# Patient Record
Sex: Male | Born: 1935 | Race: White | Hispanic: No | Marital: Married | Smoking: Never smoker
Health system: Southern US, Community
[De-identification: ages and names within clinical notes are randomized; demographics above are authoritative.]

## PROBLEM LIST (undated history)

## (undated) DIAGNOSIS — G20A1 Parkinson's disease without dyskinesia, without mention of fluctuations: Secondary | ICD-10-CM

## (undated) DIAGNOSIS — E785 Hyperlipidemia, unspecified: Secondary | ICD-10-CM

## (undated) DIAGNOSIS — C801 Malignant (primary) neoplasm, unspecified: Secondary | ICD-10-CM

## (undated) DIAGNOSIS — G20 Parkinson's disease: Secondary | ICD-10-CM

## (undated) DIAGNOSIS — Z974 Presence of external hearing-aid: Secondary | ICD-10-CM

## (undated) DIAGNOSIS — R159 Full incontinence of feces: Secondary | ICD-10-CM

## (undated) DIAGNOSIS — I10 Essential (primary) hypertension: Secondary | ICD-10-CM

## (undated) DIAGNOSIS — D649 Anemia, unspecified: Secondary | ICD-10-CM

## (undated) DIAGNOSIS — Z950 Presence of cardiac pacemaker: Secondary | ICD-10-CM

## (undated) DIAGNOSIS — Z8619 Personal history of other infectious and parasitic diseases: Secondary | ICD-10-CM

## (undated) DIAGNOSIS — I482 Chronic atrial fibrillation, unspecified: Secondary | ICD-10-CM

## (undated) DIAGNOSIS — I499 Cardiac arrhythmia, unspecified: Secondary | ICD-10-CM

## (undated) DIAGNOSIS — K529 Noninfective gastroenteritis and colitis, unspecified: Secondary | ICD-10-CM

## (undated) DIAGNOSIS — E559 Vitamin D deficiency, unspecified: Secondary | ICD-10-CM

## (undated) HISTORY — PX: FRACTURE SURGERY: SHX138

## (undated) HISTORY — PX: OTHER SURGICAL HISTORY: SHX169

## (undated) HISTORY — PX: APPENDECTOMY: SHX54

---

## 1970-06-05 HISTORY — PX: TONSILLECTOMY: SUR1361

## 1970-06-05 HISTORY — PX: CHOLECYSTECTOMY: SHX55

## 1978-06-05 HISTORY — PX: VASECTOMY: SHX75

## 2005-10-18 ENCOUNTER — Ambulatory Visit: Payer: Self-pay | Admitting: Gastroenterology

## 2009-07-20 ENCOUNTER — Ambulatory Visit (HOSPITAL_COMMUNITY): Admission: RE | Admit: 2009-07-20 | Discharge: 2009-07-20 | Payer: Self-pay | Admitting: Urology

## 2010-04-25 ENCOUNTER — Ambulatory Visit: Payer: Self-pay | Admitting: General Practice

## 2010-05-20 ENCOUNTER — Ambulatory Visit: Payer: Self-pay | Admitting: Internal Medicine

## 2010-05-22 ENCOUNTER — Emergency Department: Payer: Self-pay | Admitting: Emergency Medicine

## 2010-06-15 ENCOUNTER — Inpatient Hospital Stay: Payer: Self-pay | Admitting: Internal Medicine

## 2010-06-22 DIAGNOSIS — N400 Enlarged prostate without lower urinary tract symptoms: Secondary | ICD-10-CM

## 2010-06-22 DIAGNOSIS — E119 Type 2 diabetes mellitus without complications: Secondary | ICD-10-CM

## 2010-06-22 DIAGNOSIS — S32009A Unspecified fracture of unspecified lumbar vertebra, initial encounter for closed fracture: Secondary | ICD-10-CM

## 2010-06-22 DIAGNOSIS — R55 Syncope and collapse: Secondary | ICD-10-CM

## 2010-06-23 DIAGNOSIS — R071 Chest pain on breathing: Secondary | ICD-10-CM

## 2010-07-28 ENCOUNTER — Encounter: Payer: Self-pay | Admitting: Internal Medicine

## 2010-08-16 ENCOUNTER — Ambulatory Visit: Payer: Medicare Other | Admitting: Internal Medicine

## 2010-08-25 LAB — CBC
HCT: 40.5 % (ref 39.0–52.0)
Hemoglobin: 13.9 g/dL (ref 13.0–17.0)
MCHC: 34.3 g/dL (ref 30.0–36.0)
MCV: 93.6 fL (ref 78.0–100.0)
Platelets: 205 10*3/uL (ref 150–400)
RDW: 12.6 % (ref 11.5–15.5)

## 2010-08-25 LAB — COMPREHENSIVE METABOLIC PANEL
Albumin: 4.1 g/dL (ref 3.5–5.2)
Alkaline Phosphatase: 40 U/L (ref 39–117)
BUN: 24 mg/dL — ABNORMAL HIGH (ref 6–23)
Calcium: 9.2 mg/dL (ref 8.4–10.5)
Creatinine, Ser: 1.21 mg/dL (ref 0.4–1.5)
Glucose, Bld: 108 mg/dL — ABNORMAL HIGH (ref 70–99)
Total Protein: 7 g/dL (ref 6.0–8.3)

## 2010-08-25 LAB — GLUCOSE, CAPILLARY: Glucose-Capillary: 108 mg/dL — ABNORMAL HIGH (ref 70–99)

## 2010-09-01 NOTE — Miscellaneous (Signed)
Summary: [facility]  [facility] Report   Imported By: Kassie Mends 08/22/2010 09:08:45  _____________________________________________________________________  External Attachment:    Type:   Image     Comment:   External Document

## 2011-01-20 ENCOUNTER — Ambulatory Visit: Payer: Self-pay | Admitting: Otolaryngology

## 2012-01-10 ENCOUNTER — Encounter (HOSPITAL_COMMUNITY): Payer: Self-pay | Admitting: General Practice

## 2012-01-10 ENCOUNTER — Inpatient Hospital Stay (HOSPITAL_COMMUNITY)
Admission: RE | Admit: 2012-01-10 | Discharge: 2012-01-12 | DRG: 243 | Disposition: A | Discharge status: Home / Self Care | Payer: Medicare Other | Source: Other Acute Inpatient Hospital | Attending: Internal Medicine | Admitting: Internal Medicine

## 2012-01-10 DIAGNOSIS — Z7901 Long term (current) use of anticoagulants: Secondary | ICD-10-CM

## 2012-01-10 DIAGNOSIS — E119 Type 2 diabetes mellitus without complications: Secondary | ICD-10-CM | POA: Diagnosis present

## 2012-01-10 DIAGNOSIS — I495 Sick sinus syndrome: Principal | ICD-10-CM

## 2012-01-10 DIAGNOSIS — Z79899 Other long term (current) drug therapy: Secondary | ICD-10-CM

## 2012-01-10 DIAGNOSIS — I4891 Unspecified atrial fibrillation: Secondary | ICD-10-CM | POA: Diagnosis present

## 2012-01-10 DIAGNOSIS — H269 Unspecified cataract: Secondary | ICD-10-CM | POA: Diagnosis present

## 2012-01-10 DIAGNOSIS — I4892 Unspecified atrial flutter: Secondary | ICD-10-CM

## 2012-01-10 DIAGNOSIS — Z950 Presence of cardiac pacemaker: Secondary | ICD-10-CM

## 2012-01-10 HISTORY — DX: Malignant (primary) neoplasm, unspecified: C80.1

## 2012-01-10 HISTORY — DX: Essential (primary) hypertension: I10

## 2012-01-10 HISTORY — DX: Cardiac arrhythmia, unspecified: I49.9

## 2012-01-10 LAB — CARDIAC PANEL(CRET KIN+CKTOT+MB+TROPI)
CK, MB: 2.4 ng/mL (ref 0.3–4.0)
Relative Index: INVALID (ref 0.0–2.5)
Relative Index: INVALID (ref 0.0–2.5)
Total CK: 49 U/L (ref 7–232)
Total CK: 44 U/L (ref 7–232)

## 2012-01-10 LAB — GLUCOSE, CAPILLARY

## 2012-01-10 LAB — PROTIME-INR: Prothrombin Time: 23.4 seconds — ABNORMAL HIGH (ref 11.6–15.2)

## 2012-01-10 MED ORDER — WARFARIN SODIUM 5 MG PO TABS: 5.0000 mg | ORAL_TABLET | Freq: Every day | ORAL | Status: DC

## 2012-01-10 MED ORDER — SODIUM CHLORIDE 0.9 % IR SOLN
80.0000 mg | Status: DC
  Filled 2012-01-10: qty 2

## 2012-01-10 MED ORDER — CEFAZOLIN SODIUM-DEXTROSE 2-3 GM-% IV SOLR
2.0000 g | INTRAVENOUS | Status: DC
  Filled 2012-01-10: qty 50

## 2012-01-10 MED ORDER — TAMSULOSIN HCL 0.4 MG PO CAPS
0.4000 mg | ORAL_CAPSULE | Freq: Every day | ORAL | Status: DC
  Administered 2012-01-11 – 2012-01-12 (×2): 0.4 mg via ORAL
  Filled 2012-01-10 (×2): qty 1

## 2012-01-10 MED ORDER — SODIUM CHLORIDE 0.9 % IJ SOLN: 3.0000 mL | INTRAMUSCULAR | Status: DC | PRN

## 2012-01-10 MED ORDER — SODIUM CHLORIDE 0.9 % IV SOLN: 250.0000 mL | INTRAVENOUS | Status: DC

## 2012-01-10 MED ORDER — SODIUM CHLORIDE 0.9 % IJ SOLN
3.0000 mL | Freq: Two times a day (BID) | INTRAMUSCULAR | Status: DC
  Administered 2012-01-10: 3 mL via INTRAVENOUS

## 2012-01-10 MED ORDER — ACETAMINOPHEN 325 MG PO TABS
650.0000 mg | ORAL_TABLET | ORAL | Status: DC | PRN
  Administered 2012-01-11: 650 mg via ORAL
  Filled 2012-01-10: qty 2

## 2012-01-10 MED ORDER — ONDANSETRON HCL 4 MG/2ML IJ SOLN: 4.0000 mg | Freq: Four times a day (QID) | INTRAMUSCULAR | Status: DC | PRN

## 2012-01-10 MED ORDER — SODIUM CHLORIDE 0.45 % IV SOLN
INTRAVENOUS | Status: DC
  Administered 2012-01-10: 20:00:00 via INTRAVENOUS

## 2012-01-10 MED ORDER — GLIMEPIRIDE 4 MG PO TABS
4.0000 mg | ORAL_TABLET | Freq: Every day | ORAL | Status: DC
  Administered 2012-01-11 – 2012-01-12 (×2): 4 mg via ORAL
  Filled 2012-01-10 (×3): qty 1

## 2012-01-10 MED ORDER — WARFARIN - PHARMACIST DOSING INPATIENT: Freq: Every day | Status: DC

## 2012-01-10 MED ORDER — WARFARIN SODIUM 5 MG PO TABS
5.0000 mg | ORAL_TABLET | Freq: Once | ORAL | Status: AC
  Administered 2012-01-10: 5 mg via ORAL
  Filled 2012-01-10: qty 1

## 2012-01-10 MED ORDER — METFORMIN HCL 500 MG PO TABS
500.0000 mg | ORAL_TABLET | Freq: Two times a day (BID) | ORAL | Status: DC
  Administered 2012-01-10 – 2012-01-12 (×3): 500 mg via ORAL
  Filled 2012-01-10 (×6): qty 1

## 2012-01-10 MED ORDER — YOU HAVE A PACEMAKER BOOK
Freq: Once | Status: AC
  Administered 2012-01-10: 20:00:00
  Filled 2012-01-10: qty 1

## 2012-01-10 NOTE — H&P (Signed)
ELECTROPHYSIOLOGY ADMISSION HISTORY & PHYSICAL  Patient ID: Raymond Navarro MRN: [id number], DOB/AGE: 04-Nov-1928   Date of Admission: 01/10/2012  Primary Physician: Tillman Abide, MD Admitting Cardiologist: Lewayne Bunting, MD Reason for Admission: Near syncope, tachy-brady syndrome  History of Present Illness Raymond Navarro is a pleasant 78 year old gentleman with a past medical history significant for atrial fibrillation/atrial flutter who has been accepted in transfer from [facility]. He reports an episode of near syncope while visiting friends in [place] on [place]. He states they were standing in the kitchen making sandwiches for lunch when he began feeling dizzy. He then told his wife he was going to sit down. Initially he thought his symptoms may be due to low blood sugar. However his symptoms worsened and he describes the sense of urgency to use the restroom. He then stood up to go to the restroom and felt extremely weak. His wife was helping him walk down the hallway when he experienced near syncope. The patient is accompanied by his wife who assists with history questions. She states he was never unresponsive. That he appeared very pale "gray", diaphoretic and weak. EMS was activated and on their arrival his systolic blood pressure was in the 70s and his blood sugar was within normal range. On arrival to the emergency department, his pulse rate was in the 40s. Raymond Navarro and his wife tell me that a decreased the dose of his AV nodal blocking medications while at [facility]. This morning he continued to experience bradycardia, with rates again in the 40s; therefore, he was transferred here for further EP evaluation and treatment. Raymond Navarro denies any history of CAD/MI. He denies any history of valvular heart disease. He denies any history of thyroid dysfunction.  Past Medical History 1. Atrial fibrillation/atrial flutter 2. Diabetes mellitus 3.  Cataracts 4. s/p tonsillectomy  Allergies/Intolerances No Known Allergies  Home Medications Medications Prior to Admission  Medication Sig Dispense Refill  . diltiazem (CARDIZEM CD) 360 MG 24 hr capsule Take 360 mg by mouth daily.      Marland Kitchen glimepiride (AMARYL) 4 MG tablet Take 4 mg by mouth daily before breakfast.      . metFORMIN (GLUCOPHAGE) 500 MG tablet Take 500 mg by mouth 2 (two) times daily with a meal.      . metoprolol tartrate (LOPRESSOR) 25 MG tablet Take 25 mg by mouth 2 (two) times daily.      . Tamsulosin HCl (FLOMAX) 0.4 MG CAPS Take 0.4 mg by mouth daily.       Marland Kitchen warfarin (COUMADIN) 5 MG tablet Take 5 mg by mouth daily.        Family History Positive for CAD   Social History Social History  . Marital Status: Widowed   Social History Main Topics  . Smoking status: Nonsmoker   . Smokeless tobacco: No  . Alcohol Use: Not regularly  . Drug Use: No   Review of Systems General: No chills, fever, night sweats or weight changes.  Cardiovascular: No chest pain, dyspnea on exertion, edema, orthopnea, palpitations, paroxysmal nocturnal dyspnea. Dermatological: No rash, lesions or masses. Respiratory: No cough, dyspnea. Urologic: No hematuria, dysuria. Abdominal: No nausea, vomiting, diarrhea, bright red blood per rectum, melena, or hematemesis. Neurologic: No visual changes, abnormal mentation or difficulty moving extremities.  All other systems reviewed and are otherwise negative except as noted above.  Physical Exam Blood pressure 175/99, pulse 109, temperature 98 F (36.7 C), temperature source Oral, resp. rate 18, height  5\' 10"  (1.778 m), weight 153 lb (69.4 kg), SpO2 96.00%.  General: Well developed, well appearing 78 year old male in no acute distress. His wife is at bedside. HEENT: Normocephalic, atraumatic. EOMs intact. Sclera nonicteric. Oropharynx clear.  Neck: Supple without bruits. No JVD. Lungs:  Respirations regular and unlabored, CTA bilaterally. No  wheezes, rales or rhonchi. Heart: RRR. S1, S2 present. No murmurs, rub, S3 or S4. Abdomen: Soft, non-tender, non-distended. BS present x 4 quadrants. No hepatosplenomegaly.  Extremities: No clubbing, cyanosis or edema. DP/PT/Radials 2+ and equal bilaterally. Psych: Normal affect. Neuro: Alert and oriented X 3. Moves all extremities spontaneously.   Labs Admission labs currently pending Labs from [facility] -  Sodium 139, potassium 4.6, chloride 102, bicarbonate 25, BUN 25, creatinine 1.17, glucose 234 CK 65, CK-MB 2.0, troponin <0.012 White blood cell count 9200, hemoglobin 13.8, hematocrit 40, platelets 178,000 INR 1.77  Radiology/Studies Chest x-ray from [facility] - lungs are clear, appears to be elevation of the right hemidiaphragm, cardiac size and pulmonary vasculature are within normal limits, mild atherosclerotic change in the thoracic aorta  12-lead ECG  Admission 12-lead ECG pending 12-lead ECG performed on admission at [facility] shows atrial flutter at 44 beats per minute  Assessment and Plan 1. Atrial flutter with tachy-brady syndrome Raymond Navarro presents with near syncope and documented slow ventricular response and atrial flutter. His near syncopal episode was accompanied by prodrome of dizziness, nausea, pallor and diaphoresis which makes it appear most consistent with a vasovagal event; however, with his heart rate on admission documented to be in the 40s, cannot definitively exclude symptomatic bradycardia as cause. There are a couple of options for treatment in this case pending whether or not he truly has documented atrial fibrillation or if he only has atrial flutter. If in fact he only has atrial flutter, then consideration could be given to EP study +RF ablation as first-line and potentially curative treatment, which would allow discontinuation of all AV nodal agents. However, if he has both atrial fibrillation and atrial  flutter, he would still require AV nodal blocking medications for rate control. Therefore, permanent pacemaker implantation would be needed for treatment of bradycardia, to allow for continued rate control.These options were discussed with Raymond Navarro and his wife in detail. Dr. Ladona Ridgel will follow to assess Raymond Navarro and provide his formal recommendations.   Signed, Rick Duff, PA-C 01/10/2012, 3:53 PM  EP Attending  Patient seen and examined. I have reviewed the physical exam, history and assessment as noted above. Only change would be that his atrial flutter is left atrial and would not be amenable to ablation, at least not without significant risk. I would propose VVI PPM which we will plan to place tomorrow. I have discussed the risk/benefits/goal/expectations of PPM as well as limitations and he wishes to proceed.  Lewayne Bunting, M.D.

## 2012-01-10 NOTE — Progress Notes (Signed)
ANTICOAGULATION CONSULT NOTE - Initial Consult  Pharmacy Consult for coumadin Indication: atrial fibrillation  No Known Allergies  Patient Measurements: Height: 5\' 10"  (177.8 cm) Weight: 153 lb (69.4 kg) IBW/kg (Calculated) : 73    Vital Signs: Temp: 98 F (36.7 C) (08/07 1500) Temp src: Oral (08/07 1500) BP: 175/99 mmHg (08/07 1500) Pulse Rate: 109  (08/07 1500)  Labs: No results found for this basename: HGB:2,HCT:3,PLT:3,APTT:3,LABPROT:3,INR:3,HEPARINUNFRC:3,CREATININE:3,CKTOTAL:3,CKMB:3,TROPONINI:3 in the last 72 hours  Estimated Creatinine Clearance: 47 ml/min (by C-G formula based on Cr of 1.21).   Medical History: No past medical history on file.  Medications:  Prescriptions prior to admission  Medication Sig Dispense Refill  . diltiazem (CARDIZEM CD) 360 MG 24 hr capsule Take 360 mg by mouth daily.      Marland Kitchen glimepiride (AMARYL) 4 MG tablet Take 4 mg by mouth daily before breakfast.      . metFORMIN (GLUCOPHAGE) 500 MG tablet Take 500 mg by mouth 2 (two) times daily with a meal.      . metoprolol tartrate (LOPRESSOR) 25 MG tablet Take 25 mg by mouth 2 (two) times daily.      . Tamsulosin HCl (FLOMAX) 0.4 MG CAPS Take 0.4 mg by mouth daily.       Marland Kitchen warfarin (COUMADIN) 5 MG tablet Take 5 mg by mouth daily.        Assessment: 78 yo man to continue couamdin for afib.  INR today at OSH was 1.77. Goal of Therapy:  INR 2-3 Monitor platelets by anticoagulation protocol: Yes   Plan:  Coumadin 5 mg today. Check daily PT/INR. Monitor for S&S bleeding.  Talbert Cage Poteet 01/10/2012,4:42 PM

## 2012-01-11 ENCOUNTER — Inpatient Hospital Stay (HOSPITAL_COMMUNITY): Payer: Medicare Other

## 2012-01-11 ENCOUNTER — Encounter (HOSPITAL_COMMUNITY)
Admission: RE | Disposition: A | Discharge status: Home / Self Care | Payer: Self-pay | Source: Other Acute Inpatient Hospital | Attending: Internal Medicine

## 2012-01-11 ENCOUNTER — Encounter: Payer: Self-pay | Admitting: Internal Medicine

## 2012-01-11 DIAGNOSIS — Z950 Presence of cardiac pacemaker: Secondary | ICD-10-CM

## 2012-01-11 DIAGNOSIS — I498 Other specified cardiac arrhythmias: Secondary | ICD-10-CM

## 2012-01-11 HISTORY — PX: PERMANENT PACEMAKER INSERTION: SHX5480

## 2012-01-11 HISTORY — DX: Presence of cardiac pacemaker: Z95.0

## 2012-01-11 LAB — GLUCOSE, CAPILLARY
Glucose-Capillary: 115 mg/dL — ABNORMAL HIGH (ref 70–99)
Glucose-Capillary: 142 mg/dL — ABNORMAL HIGH (ref 70–99)

## 2012-01-11 LAB — CARDIAC PANEL(CRET KIN+CKTOT+MB+TROPI)
Relative Index: INVALID (ref 0.0–2.5)
Troponin I: <0.3 ng/mL (ref <0.30)

## 2012-01-11 LAB — CBC
Hemoglobin: 14.9 g/dL (ref 13.0–17.0)
MCV: 90.2 fL (ref 78.0–100.0)
Platelets: 169 10*3/uL (ref 150–400)
RBC: 4.89 MIL/uL (ref 4.22–5.81)
WBC: 8.7 10*3/uL (ref 4.0–10.5)

## 2012-01-11 LAB — TSH: TSH: 4.053 u[IU]/mL (ref 0.350–4.500)

## 2012-01-11 LAB — BASIC METABOLIC PANEL
CO2: 29 mEq/L (ref 19–32)
Calcium: 9 mg/dL (ref 8.4–10.5)
GFR calc non Af Amer: 78 mL/min — ABNORMAL LOW (ref >90)
Potassium: 4 mEq/L (ref 3.5–5.1)
Sodium: 141 mEq/L (ref 135–145)

## 2012-01-11 LAB — PROTIME-INR: Prothrombin Time: 21.1 seconds — ABNORMAL HIGH (ref 11.6–15.2)

## 2012-01-11 SURGERY — PERMANENT PACEMAKER INSERTION
Anesthesia: LOCAL

## 2012-01-11 MED ORDER — HEPARIN (PORCINE) IN NACL 2-0.9 UNIT/ML-% IJ SOLN
INTRAMUSCULAR | Status: AC
  Filled 2012-01-11: qty 1000

## 2012-01-11 MED ORDER — CEFAZOLIN SODIUM 1-5 GM-% IV SOLN
1.0000 g | Freq: Four times a day (QID) | INTRAVENOUS | Status: AC
  Administered 2012-01-11 – 2012-01-12 (×3): 1 g via INTRAVENOUS
  Filled 2012-01-11 (×3): qty 50

## 2012-01-11 MED ORDER — ONDANSETRON HCL 4 MG/2ML IJ SOLN: 4.0000 mg | Freq: Four times a day (QID) | INTRAMUSCULAR | Status: DC | PRN

## 2012-01-11 MED ORDER — WARFARIN SODIUM 7.5 MG PO TABS
7.5000 mg | ORAL_TABLET | Freq: Once | ORAL | Status: AC
  Administered 2012-01-11: 7.5 mg via ORAL
  Filled 2012-01-11: qty 1

## 2012-01-11 MED ORDER — METOPROLOL TARTRATE 25 MG PO TABS
25.0000 mg | ORAL_TABLET | Freq: Two times a day (BID) | ORAL | Status: DC
  Administered 2012-01-11 – 2012-01-12 (×3): 25 mg via ORAL
  Filled 2012-01-11 (×4): qty 1

## 2012-01-11 MED ORDER — MIDAZOLAM HCL 2 MG/2ML IJ SOLN
INTRAMUSCULAR | Status: AC
  Filled 2012-01-11: qty 2

## 2012-01-11 MED ORDER — FENTANYL CITRATE 0.05 MG/ML IJ SOLN
INTRAMUSCULAR | Status: AC
  Filled 2012-01-11: qty 2

## 2012-01-11 MED ORDER — LIDOCAINE HCL (PF) 1 % IJ SOLN
INTRAMUSCULAR | Status: AC
  Filled 2012-01-11: qty 60

## 2012-01-11 MED ORDER — DILTIAZEM HCL ER COATED BEADS 360 MG PO CP24
360.0000 mg | ORAL_CAPSULE | Freq: Every day | ORAL | Status: DC
  Administered 2012-01-11 – 2012-01-12 (×2): 360 mg via ORAL
  Filled 2012-01-11 (×3): qty 1

## 2012-01-11 MED ORDER — ACETAMINOPHEN 325 MG PO TABS
325.0000 mg | ORAL_TABLET | ORAL | Status: DC | PRN
  Administered 2012-01-12: 650 mg via ORAL
  Filled 2012-01-11: qty 2

## 2012-01-11 NOTE — Op Note (Signed)
NAMEJAMORIAN, Raymond Navarro              ACCOUNT NO.:  [id number]  MEDICAL RECORD NO.:  [id number]  LOCATION:  [facility]                         FACILITY:  [facility]  PHYSICIAN:  Doylene Canning. Ladona Ridgel, MD    DATE OF BIRTH:  [birth year]  DATE OF PROCEDURE:  01/11/2012 DATE OF DISCHARGE:                              OPERATIVE REPORT   PROCEDURE PERFORMED:  Insertion of a single-chamber pacemaker.  INDICATION:  Symptomatic bradycardia in the setting of chronic AFib and flutter.  INTRODUCTION:  The patient is an 78 year old male with a history of chronic atrial fibrillation/flutter.  He has had tachy-brady syndrome in the past.  His ventricular rates have been fairly well controlled.  He was visiting [place] in the [place] when he developed symptomatic bradycardia with heart rates in the 30s and 40s. This persisted.  With discontinuation of his medications, his heart rates increased over 110 beats per minute.  With symptomatic tachy- brady, he is now referred for permanent pacemaker insertion.  PROCEDURE:  After informed consent was obtained, the patient was taken to diagnostic catheterization lab in a fasting state.  After usual preparation and draping, intravenous fentanyl and midazolam was given for sedation.  A 30 mL of lidocaine was infiltrated into the left infraclavicular region.  Initial attempts to puncture the subclavian vein were unsuccessful.  A 10 mL of contrast was injected into the left upper extremity venous system, demonstrating that the vein was patent but displaced caudally.  It was then punctured and the Medtronic model 5076, 58 cm active fixation pacing lead, serial number [id number] was advanced into the right ventricle.  Mapping was carried out and in the final site, the R-waves measured 7.5 mV, with a pacing impedance 1100 ohms and threshold 0.4 V at 0.5 msec.  A prominent injury current was present and 10 V pacing did not stimulate the diaphragm.  With  the ventricular lead in satisfactory position, it was secured to the subpectoralis fascia with a figure-of-eight silk suture.  The sewing sleeve was also secured with silk suture.  A Medtronic Sensia single chamber pacemaker, serial number [id number] was connected to the pacing lead and placed back in the subcutaneous pocket.  The pocket was irrigated with antibiotic irrigation and the incision was closed with 2- 0 and 3-0 Vicryl.  Benzoin and Steri-Strips were painted on the skin, pressure dressing was applied, and the patient was returned to his room in satisfactory condition.  COMPLICATIONS:  There were no immediate procedure complications.  RESULTS:  Demonstrate successful implantation of a Medtronic single- chamber pacemaker in a patient with symptomatic bradycardia and chronic atrial fibrillation and flutter.     Doylene Canning. Ladona Ridgel, MD     GWT/MEDQ  D:  01/11/2012  T:  01/11/2012  Job:  [id number]  cc:   Arnoldo Hooker, MD

## 2012-01-11 NOTE — Interval H&P Note (Signed)
History and Physical Interval Note:  01/11/2012 7:36 AM  Raymond Navarro  has presented today for surgery, with the diagnosis of sinus Arrest  The various methods of treatment have been discussed with the patient and family. After consideration of risks, benefits and other options for treatment, the patient has consented to  Procedure(s) (LRB): PERMANENT PACEMAKER INSERTION (N/A) as a surgical intervention .  The patient's history has been reviewed, patient examined, no change in status, stable for surgery.  I have reviewed the patient's chart and labs.  Questions were answered to the patient's satisfaction.     Buel Ream.D

## 2012-01-11 NOTE — Progress Notes (Signed)
ANTICOAGULATION CONSULT NOTE - Follow Up Consult  Pharmacy Consult for Coumadin Indication: atrial fibrillation  No Known Allergies  Patient Measurements: Height: 5\' 10"  (177.8 cm) Weight: 153 lb (69.4 kg) IBW/kg (Calculated) : 73  Heparin Dosing Weight: 3  Vital Signs: Temp: 97.8 F (36.6 C) (08/08 0500) Temp src: Oral (08/08 0500) BP: 151/93 mmHg (08/08 0500) Pulse Rate: 83  (08/08 0752)  Labs:  Basename 01/11/12 0410 01/10/12 2130 01/10/12 1736  HGB 14.9 -- --  HCT 44.1 -- --  PLT 169 -- --  APTT -- -- --  LABPROT 21.1* -- 23.4*  INR 1.79* -- 2.04*  HEPARINUNFRC -- -- --  CREATININE 0.90 -- --  CKTOTAL 48 44 49  CKMB 2.7 2.3 2.4  TROPONINI <0.30 <0.30 <0.30    Estimated Creatinine Clearance: 63.2 ml/min (by C-G formula based on Cr of 0.9).   Assessment: Transferred from OSH for further EP eval and PPM insertion for near syncope and tachy/brady syndrome.  Anticoagulation: coumadin PTA 5mg  daily for afib/flutter. INR at OSH was 1.77. INR today down to 1.79   DM: CBG 127,122 on metformin  Goal of Therapy:  INR 2-3 Monitor platelets by anticoagulation protocol: Yes   Plan:  Coumadin 7.5mg  po x 1 today.  Merilynn Finland, [employer] 01/11/2012,9:51 AM

## 2012-01-11 NOTE — Op Note (Signed)
VVI PPM inserted via the left subclavian vein without immediate complication. [id number].

## 2012-01-12 ENCOUNTER — Encounter: Payer: Self-pay | Admitting: *Deleted

## 2012-01-12 ENCOUNTER — Inpatient Hospital Stay (HOSPITAL_COMMUNITY): Payer: Medicare Other

## 2012-01-12 DIAGNOSIS — Z950 Presence of cardiac pacemaker: Secondary | ICD-10-CM | POA: Insufficient documentation

## 2012-01-12 LAB — GLUCOSE, CAPILLARY: Glucose-Capillary: 175 mg/dL — ABNORMAL HIGH (ref 70–99)

## 2012-01-12 MED ORDER — WARFARIN SODIUM 5 MG PO TABS
5.0000 mg | ORAL_TABLET | Freq: Every day | ORAL | Status: DC
  Filled 2012-01-12: qty 1

## 2012-01-12 NOTE — Discharge Summary (Signed)
ELECTROPHYSIOLOGY DISCHARGE SUMMARY    Patient ID: Raymond Navarro,  MRN: [id number], DOB/AGE: 76/22/1930 76 y.o.  Admit date: 01/10/2012 Discharge date: 01/12/2012  Primary Care Physician: Tillman Abide, MD Primary Cardiologist: Gwen Pounds, MD in [place], [place]  Primary Discharge Diagnosis:  1. Tachy-brady syndrome s/p PPM implantation 2. Chronic atrial fibrillation/atrial flutter  Secondary Discharge Diagnoses:  1. DM 2. Cataracts  Procedures This Admission:  1. Single chamber/VVI PPM implantation 01/11/2012 Medtronic model 5076, 58 cm active fixation pacing lead, serial number [id number] was advanced into the right ventricle. Medtronic [place] single chamber pacemaker, serial number [id number].  History and Hospital Course:  Please see admission H&P for full details. Briefly, Raymond Navarro is a pleasant 78 year old gentleman with chronic atrial fibrillation/atrial flutter who was admitted with near syncope. He was found to have bradycardia with rates in the 30s-40s. He was diagnosed with tachy-brady syndrome and PPM implantation was recommended to allow for continuation of his rate control regimen. He underwent single chamber PPM implantation yesterday 01/11/2012. Raymond Navarro tolerated this procedure well without any immediate complication. He remains hemodynamically stable and afebrile. His chest xray shows stable lead placement without pneumothorax. His device interrogation shows normal PPM function with stable lead parameters/measurements. His implant site is intact without significant bleeding or hematoma. He has been given discharge instructions including wound care and activity restrictions. He will follow-up in 10 days for wound check. There were no changes made to his medications. He has been seen, examined and deemed stable for discharge today by Dr. Lewayne Bunting.  Physical Exam:  Vitals: Blood pressure 125/78, pulse 77, temperature 98.2 F (36.8 C), temperature source Oral,  resp. rate 16, height 5\' 10"  (1.778 m), weight 153 lb (69.4 kg), SpO2 95.00%.  General: Well developed, well appearing 78 year old male in no acute distress. Heart: Regular S1, S2 without M/R/S3/S4. Lungs: CTA bilaterally. No wheezes, rales or rhonchi. Extremities: No cyanosis, clubbing or edema. Skin: Left upper chest/implant site intact without bleeding or hematoma.  Labs: Lab Results  Component Value Date   WBC 8.7 01/11/2012   HGB 14.9 01/11/2012   HCT 44.1 01/11/2012   MCV 90.2 01/11/2012   PLT 169 01/11/2012     Lab 01/11/12 0410  NA 141  K 4.0  CL 103  CO2 29  BUN 13  CREATININE 0.90  CALCIUM 9.0  PROT --  BILITOT --  ALKPHOS --  ALT --  AST --  GLUCOSE 114*   Lab Results  Component Value Date   CKTOTAL 48 01/11/2012   CKMB 2.7 01/11/2012   TROPONINI <0.30 01/11/2012     Basename 01/11/12 0410  INR 1.79*    Disposition:  The patient is being discharged in stable condition.  Follow-up: Follow-up Information    Follow up with [facility] on 01/22/2012. (At 1:15 PM  for wound check)    Contact information:   [address] [place] [address] [phone]      Follow up with Lewayne Bunting, MD on 04/12/2012. (At 10:00 AM)    Contact information:   [address] [place] [address] [phone]       Follow up with Lamar Blinks, MD. (As previously scheduled next week)    Contact information:   [address] [place] [address] [phone]        Discharge Medications:  Medication List  As of 01/12/2012  8:20 AM  TAKE these medications         diltiazem 360 MG 24 hr capsule   Commonly known as: CARDIZEM CD   Take 360 mg by mouth daily.      glimepiride 4 MG tablet   Commonly known as: AMARYL   Take 4 mg by mouth daily before breakfast.      metFORMIN 500 MG tablet   Commonly known as: GLUCOPHAGE   Take 500 mg by mouth 2 (two) times daily with a  meal.      metoprolol tartrate 25 MG tablet   Commonly known as: LOPRESSOR   Take 25 mg by mouth 2 (two) times daily.      Tamsulosin HCl 0.4 MG Caps   Commonly known as: FLOMAX   Take 0.4 mg by mouth daily.      warfarin 5 MG tablet   Commonly known as: COUMADIN   Take 5 mg by mouth daily.          Duration of Discharge Encounter: Greater than 30 minutes including physician time.  Signed, Rick Duff, PA-C 01/12/2012, 8:20 AM  EP attending  Patient seen and examined. Agree with plan as noted above.  Lewayne Bunting, M.D.

## 2012-01-12 NOTE — Progress Notes (Signed)
ANTICOAGULATION CONSULT NOTE - Follow Up Consult  Pharmacy Consult for Coumadin Indication: atrial fibrillation  No Known Allergies  Patient Measurements: Height: 5\' 10"  (177.8 cm) Weight: 153 lb (69.4 kg) IBW/kg (Calculated) : 73  Heparin Dosing Weight: 3  Vital Signs: Temp: 98.2 F (36.8 C) (08/09 0616) Temp src: Oral (08/09 0616) BP: 125/78 mmHg (08/09 0616) Pulse Rate: 77  (08/09 0616)  Labs:  Basename 01/12/12 0836 01/11/12 0410 01/10/12 2130 01/10/12 1736  HGB -- 14.9 -- --  HCT -- 44.1 -- --  PLT -- 169 -- --  APTT -- -- -- --  LABPROT 24.3* 21.1* -- 23.4*  INR 2.14* 1.79* -- 2.04*  HEPARINUNFRC -- -- -- --  CREATININE -- 0.90 -- --  CKTOTAL -- 48 44 49  CKMB -- 2.7 2.3 2.4  TROPONINI -- <0.30 <0.30 <0.30    Estimated Creatinine Clearance: 63.2 ml/min (by C-G formula based on Cr of 0.9).   Assessment: Transferred from OSH for further EP eval and PPM insertion for near syncope and tachy/brady syndrome.  Anticoagulation: coumadin PTA 5mg  daily for afib/flutter. INR = 2.14 today after giving a slightly higher dose yesterday.   Goal of Therapy:  INR 2-3 Monitor platelets by anticoagulation protocol: Yes   Plan:  Coumadin 5 mg PO qday  Raymond Navarro 01/12/2012,9:38 AM

## 2012-01-18 ENCOUNTER — Telehealth: Payer: Self-pay | Admitting: Internal Medicine

## 2012-01-18 NOTE — Telephone Encounter (Signed)
Spoke w/pt in regards to taking a shower. Pt implanted 01-11-12. Pt also reminded of appt with Brooke 01-23-12 @ 1315.

## 2012-01-18 NOTE — Telephone Encounter (Signed)
New msg Pt had device put in and he wanted to know when he can take a shower

## 2012-01-23 ENCOUNTER — Encounter: Payer: Self-pay | Admitting: Cardiology

## 2012-01-23 ENCOUNTER — Ambulatory Visit (INDEPENDENT_AMBULATORY_CARE_PROVIDER_SITE_OTHER): Payer: Medicare Other | Admitting: Cardiology

## 2012-01-23 ENCOUNTER — Encounter: Payer: Self-pay | Admitting: Internal Medicine

## 2012-01-23 VITALS — BP 128/64 | HR 68 | Ht 70.0 in | Wt 154.0 lb

## 2012-01-23 DIAGNOSIS — I495 Sick sinus syndrome: Secondary | ICD-10-CM

## 2012-01-23 DIAGNOSIS — Z950 Presence of cardiac pacemaker: Secondary | ICD-10-CM

## 2012-01-23 LAB — PACEMAKER DEVICE OBSERVATION
BRDY-0002RV: 60 {beats}/min
RV LEAD IMPEDENCE PM: 685 Ohm

## 2012-01-23 NOTE — Progress Notes (Signed)
Wound check, device check only. See PaceArt.

## 2012-04-10 ENCOUNTER — Encounter: Payer: Self-pay | Admitting: Internal Medicine

## 2012-04-10 ENCOUNTER — Ambulatory Visit (INDEPENDENT_AMBULATORY_CARE_PROVIDER_SITE_OTHER): Payer: Medicare Other | Admitting: Internal Medicine

## 2012-04-10 VITALS — BP 126/66 | HR 79 | Ht 70.0 in | Wt 159.0 lb

## 2012-04-10 DIAGNOSIS — Z950 Presence of cardiac pacemaker: Secondary | ICD-10-CM

## 2012-04-10 DIAGNOSIS — I4891 Unspecified atrial fibrillation: Secondary | ICD-10-CM

## 2012-04-10 LAB — PACEMAKER DEVICE OBSERVATION
BATTERY VOLTAGE: 2.79 V
BMOD-0005RV: 95 {beats}/min
RV LEAD AMPLITUDE: 15.68 mv
RV LEAD THRESHOLD: 0.5 V

## 2012-04-10 NOTE — Patient Instructions (Signed)
Your physician wants you to follow-up in: 9 months in [place] with Dr. Ladona Ridgel.  You will receive a reminder letter in the mail two months in advance. If you don't receive a letter, please call our office to schedule the follow-up appointment.

## 2012-04-10 NOTE — Assessment & Plan Note (Signed)
His ventricular rates appear to be well-controlled. He will continue his current medical therapy.

## 2012-04-10 NOTE — Assessment & Plan Note (Signed)
His Medtronic single-chamber pacemaker is working normally. We'll plan to recheck in several months. 

## 2012-04-10 NOTE — Progress Notes (Signed)
HPI Raymond Navarro returns today for followup. He is a very pleasant 78 year old man with a history of hypertension, chronic atrial fibrillation, symptomatic bradycardia, status post permanent pacemaker insertion. In the interim, he has done well. He underwent pacemaker insertion 3 months ago and has had minimal discomfort at the insertion site. No syncope, no chest pain, no shortness of breath, and minimal peripheral edema. No Known Allergies   Current Outpatient Prescriptions  Medication Sig Dispense Refill  . diltiazem (CARDIZEM CD) 360 MG 24 hr capsule Take 360 mg by mouth daily.      Marland Kitchen glimepiride (AMARYL) 4 MG tablet Take 4 mg by mouth daily before breakfast.      . metFORMIN (GLUCOPHAGE) 500 MG tablet Take 500 mg by mouth 2 (two) times daily with a meal.      . metoprolol tartrate (LOPRESSOR) 25 MG tablet Take 25 mg by mouth 2 (two) times daily.      . Tamsulosin HCl (FLOMAX) 0.4 MG CAPS Take 0.4 mg by mouth daily.       Marland Kitchen warfarin (COUMADIN) 5 MG tablet Take 5 mg by mouth as directed.          Past Medical History  Diagnosis Date  . Hypertension   . Dysrhythmia     ATRIAL FIBRILATION  . Diabetes mellitus     type 2  . Cancer     hx of skin cancer    ROS:   All systems reviewed and negative except as noted in the HPI.   Past Surgical History  Procedure Date  . Tonsillectomy   . Cholecystectomy   . Appendectomy   . Vasectomy      No family history on file.   History   Social History  . Marital Status: Widowed    Spouse Name: N/A    Number of Children: N/A  . Years of Education: N/A   Occupational History  . Not on file.   Social History Main Topics  . Smoking status: Never Smoker   . Smokeless tobacco: Never Used  . Alcohol Use: Yes     Comment: occasional  . Drug Use: No  . Sexually Active:    Other Topics Concern  . Not on file   Social History Narrative  . No narrative on file     BP 126/66  Pulse 79  Ht 5\' 10"  (1.778 m)  Wt 159 lb  (72.122 kg)  BMI 22.81 kg/m2  SpO2 98%  Physical Exam:  Well appearing elderly man, NAD HEENT: Unremarkable Neck:  7 cm JVD, no thyromegally Lungs:  Clear with no wheezes, rales, or rhonchi.  HEART:  IRegular rate rhythm, no murmurs, no rubs, no clicks Abd:  soft, positive bowel sounds, no organomegally, no rebound, no guarding Ext:  2 plus pulses, no edema, no cyanosis, no clubbing Skin:  No rashes no nodules Neuro:  CN II through XII intact, motor grossly intact  DEVICE  Normal device function.  See PaceArt for details.   Assess/Plan:

## 2012-04-12 ENCOUNTER — Encounter: Payer: Medicare Other | Admitting: Internal Medicine

## 2012-07-13 LAB — COMPREHENSIVE METABOLIC PANEL
Albumin: 3.9 g/dL (ref 3.4–5.0)
Anion Gap: 6 — ABNORMAL LOW (ref 7–16)
BUN: 18 mg/dL (ref 7–18)
Bilirubin,Total: 0.7 mg/dL (ref 0.2–1.0)
Calcium, Total: 8.7 mg/dL (ref 8.5–10.1)
Creatinine: 0.62 mg/dL (ref 0.60–1.30)
EGFR (Non-African Amer.): >60
Potassium: 4.2 mmol/L (ref 3.5–5.1)
SGPT (ALT): 31 U/L (ref 12–78)
Sodium: 137 mmol/L (ref 136–145)
Total Protein: 7.3 g/dL (ref 6.4–8.2)

## 2012-07-13 LAB — CBC
MCH: 30.5 pg (ref 26.0–34.0)
MCHC: 34 g/dL (ref 32.0–36.0)
MCV: 90 fL (ref 80–100)
Platelet: 190 10*3/uL (ref 150–440)
RBC: 4.74 10*6/uL (ref 4.40–5.90)

## 2012-07-13 LAB — PROTIME-INR: INR: 2.4

## 2012-07-13 LAB — LIPASE, BLOOD: Lipase: 104 U/L (ref 73–393)

## 2012-07-13 LAB — APTT: Activated PTT: 30.1 secs (ref 23.6–35.9)

## 2012-07-14 ENCOUNTER — Inpatient Hospital Stay: Payer: Self-pay | Admitting: Internal Medicine

## 2012-07-15 LAB — CBC WITH DIFFERENTIAL/PLATELET
Eosinophil #: 0.5 10*3/uL (ref 0.0–0.7)
Eosinophil %: 5.1 %
HCT: 40 % (ref 40.0–52.0)
Lymphocyte #: 1.4 10*3/uL (ref 1.0–3.6)
Lymphocyte %: 14.8 %
MCHC: 34.5 g/dL (ref 32.0–36.0)
MCV: 89 fL (ref 80–100)
Monocyte #: 0.9 x10 3/mm (ref 0.2–1.0)
Neutrophil #: 6.9 10*3/uL — ABNORMAL HIGH (ref 1.4–6.5)
Platelet: 171 10*3/uL (ref 150–440)
WBC: 9.8 10*3/uL (ref 3.8–10.6)

## 2012-07-15 LAB — MAGNESIUM: Magnesium: 1.4 mg/dL — ABNORMAL LOW

## 2012-07-15 LAB — PROTIME-INR: INR: 1.8

## 2012-07-16 LAB — CBC WITH DIFFERENTIAL/PLATELET
Basophil %: 0.4 %
Eosinophil #: 0.2 10*3/uL (ref 0.0–0.7)
Eosinophil %: 1.8 %
HCT: 41.1 % (ref 40.0–52.0)
Lymphocyte #: 1.6 10*3/uL (ref 1.0–3.6)
MCH: 29.9 pg (ref 26.0–34.0)
MCHC: 33.4 g/dL (ref 32.0–36.0)
Neutrophil #: 8.6 10*3/uL — ABNORMAL HIGH (ref 1.4–6.5)
Neutrophil %: 75 %
Platelet: 191 10*3/uL (ref 150–440)
WBC: 11.4 10*3/uL — ABNORMAL HIGH (ref 3.8–10.6)

## 2012-07-16 LAB — PROTIME-INR: INR: 1.2

## 2012-07-16 LAB — MAGNESIUM: Magnesium: 1.7 mg/dL — ABNORMAL LOW

## 2012-07-17 LAB — BASIC METABOLIC PANEL
Anion Gap: 7 (ref 7–16)
BUN: 18 mg/dL (ref 7–18)
Calcium, Total: 7.9 mg/dL — ABNORMAL LOW (ref 8.5–10.1)
Chloride: 101 mmol/L (ref 98–107)
Co2: 27 mmol/L (ref 21–32)
EGFR (African American): >60
EGFR (Non-African Amer.): >60
Glucose: 130 mg/dL — ABNORMAL HIGH (ref 65–99)
Osmolality: 274 (ref 275–301)
Potassium: 4.1 mmol/L (ref 3.5–5.1)

## 2012-07-17 LAB — MAGNESIUM: Magnesium: 1.7 mg/dL — ABNORMAL LOW

## 2012-07-17 LAB — PLATELET COUNT: Platelet: 171 10*3/uL (ref 150–440)

## 2012-07-18 LAB — CBC WITH DIFFERENTIAL/PLATELET
Basophil #: 0.1 10*3/uL (ref 0.0–0.1)
Basophil %: 0.6 %
Eosinophil #: 0.5 10*3/uL (ref 0.0–0.7)
HCT: 38 % — ABNORMAL LOW (ref 40.0–52.0)
HGB: 13.2 g/dL (ref 13.0–18.0)
Lymphocyte #: 1.5 10*3/uL (ref 1.0–3.6)
Lymphocyte %: 15.1 %
MCHC: 34.8 g/dL (ref 32.0–36.0)
Monocyte #: 0.8 x10 3/mm (ref 0.2–1.0)
Monocyte %: 8.3 %
Neutrophil #: 7 10*3/uL — ABNORMAL HIGH (ref 1.4–6.5)
Platelet: 177 10*3/uL (ref 150–440)
RDW: 13.2 % (ref 11.5–14.5)
WBC: 9.9 10*3/uL (ref 3.8–10.6)

## 2012-07-18 LAB — MAGNESIUM: Magnesium: 1.9 mg/dL

## 2012-07-18 LAB — COMPREHENSIVE METABOLIC PANEL
Albumin: 3 g/dL — ABNORMAL LOW (ref 3.4–5.0)
Anion Gap: 7 (ref 7–16)
Bilirubin,Total: 1 mg/dL (ref 0.2–1.0)
Calcium, Total: 7.8 mg/dL — ABNORMAL LOW (ref 8.5–10.1)
Chloride: 105 mmol/L (ref 98–107)
Creatinine: 0.83 mg/dL (ref 0.60–1.30)
EGFR (African American): >60
EGFR (Non-African Amer.): >60
Osmolality: 280 (ref 275–301)
Potassium: 3.8 mmol/L (ref 3.5–5.1)
SGPT (ALT): 20 U/L (ref 12–78)
Total Protein: 6 g/dL — ABNORMAL LOW (ref 6.4–8.2)

## 2012-07-18 LAB — PROTIME-INR
INR: 1.3
Prothrombin Time: 16.3 s — ABNORMAL HIGH (ref 11.5–14.7)

## 2012-07-19 DIAGNOSIS — S7290XA Unspecified fracture of unspecified femur, initial encounter for closed fracture: Secondary | ICD-10-CM

## 2012-07-19 DIAGNOSIS — N400 Enlarged prostate without lower urinary tract symptoms: Secondary | ICD-10-CM

## 2012-07-19 DIAGNOSIS — I4891 Unspecified atrial fibrillation: Secondary | ICD-10-CM

## 2012-07-19 DIAGNOSIS — E119 Type 2 diabetes mellitus without complications: Secondary | ICD-10-CM

## 2012-12-02 ENCOUNTER — Ambulatory Visit: Payer: Self-pay | Admitting: General Practice

## 2013-01-09 ENCOUNTER — Ambulatory Visit (INDEPENDENT_AMBULATORY_CARE_PROVIDER_SITE_OTHER): Payer: Medicare Other | Admitting: Internal Medicine

## 2013-01-09 ENCOUNTER — Encounter: Payer: Self-pay | Admitting: Internal Medicine

## 2013-01-09 VITALS — BP 133/76 | HR 69 | Ht 70.5 in | Wt 154.5 lb

## 2013-01-09 DIAGNOSIS — I4891 Unspecified atrial fibrillation: Secondary | ICD-10-CM

## 2013-01-09 DIAGNOSIS — Z950 Presence of cardiac pacemaker: Secondary | ICD-10-CM

## 2013-01-09 DIAGNOSIS — Z01810 Encounter for preprocedural cardiovascular examination: Secondary | ICD-10-CM | POA: Insufficient documentation

## 2013-01-09 LAB — PACEMAKER DEVICE OBSERVATION
BRDY-0002RV: 60 {beats}/min
BRDY-0004RV: 120 {beats}/min
RV LEAD IMPEDENCE PM: 599 Ohm

## 2013-01-09 NOTE — Assessment & Plan Note (Signed)
Her Medtronic pacemaker is working normally. We'll plan to recheck in several months. 

## 2013-01-09 NOTE — Patient Instructions (Addendum)
Your physician wants you to follow-up in: 1 year with Dr Klein.  You will receive a reminder letter in the mail two months in advance. If you don't receive a letter, please call our office to schedule the follow-up appointment.  

## 2013-01-09 NOTE — Progress Notes (Signed)
HPI Raymond Navarro returns today for followup. She is a very pleasant elderly woman with a history of symptomatic bradycardia, status post permanent pacemaker insertion, chronic atrial fibrillation, with severe arthritis, who is pending knee surgery. In the interim, she has been stable except for problems with falls. She has not injured herself. Allergies  Allergen Reactions  . Niacin And Related     Rash and itching     Current Outpatient Prescriptions  Medication Sig Dispense Refill  . diltiazem (CARDIZEM CD) 360 MG 24 hr capsule Take 360 mg by mouth daily.      Marland Kitchen glimepiride (AMARYL) 4 MG tablet Take 4 mg by mouth 2 (two) times daily.       . metFORMIN (GLUCOPHAGE) 500 MG tablet Take 500 mg by mouth 2 (two) times daily with a meal.      . metoprolol tartrate (LOPRESSOR) 25 MG tablet Take 25 mg by mouth 2 (two) times daily.      . Tamsulosin HCl (FLOMAX) 0.4 MG CAPS Take 0.4 mg by mouth daily.       . traMADol (ULTRAM) 50 MG tablet Take 50 mg by mouth every 6 (six) hours as needed.       . warfarin (COUMADIN) 4 MG tablet Take 4 mg by mouth as directed.      . warfarin (COUMADIN) 5 MG tablet Take 5 mg by mouth as directed.        No current facility-administered medications for this visit.     Past Medical History  Diagnosis Date  . Hypertension   . Dysrhythmia     ATRIAL FIBRILATION  . Diabetes mellitus     type 2  . Cancer     hx of skin cancer    ROS:   All systems reviewed and negative except as noted in the HPI.   Past Surgical History  Procedure Laterality Date  . Tonsillectomy    . Cholecystectomy    . Appendectomy    . Vasectomy    . Hip surgery       Family History  Problem Relation Age of Onset  . Family history unknown: Yes     History   Social History  . Marital Status: Married    Spouse Name: N/A    Number of Children: N/A  . Years of Education: N/A   Occupational History  . Not on file.   Social History Main Topics  . Smoking status:  Never Smoker   . Smokeless tobacco: Never Used  . Alcohol Use: No     Comment: occasional  . Drug Use: No  . Sexually Active: Not on file   Other Topics Concern  . Not on file   Social History Narrative  . No narrative on file     BP 133/76  Pulse 69  Ht 5' 10.5" (1.791 m)  Wt 154 lb 8 oz (70.081 kg)  BMI 21.85 kg/m2  Physical Exam:  Stable appearing elderly woman, frail, but in NAD HEENT: Unremarkable Neck:  6 cm JVD, no thyromegally Lungs:  Clear with no wheezes, rales, or rhonchi. HEART:  Regular rate rhythm, no murmurs, no rubs, no clicks Abd:  soft, positive bowel sounds, no organomegally, no rebound, no guarding Ext:  2 plus pulses, no edema, no cyanosis, no clubbing Skin:  No rashes no nodules Neuro:  CN II through XII intact, motor grossly intact  EKG - atrial fibrillation with ventricular pacing  DEVICE  Normal device function.  See PaceArt for details.  Assess/Plan:

## 2013-01-09 NOTE — Assessment & Plan Note (Signed)
Her ventricular rate appears to well controlled. She will continue her current medications.

## 2013-01-09 NOTE — Assessment & Plan Note (Signed)
She is low risk for major cardiovascular complications from knee surgery. She may be out of stop her blood thinner for approximately 5 days prior to surgery.

## 2013-01-13 ENCOUNTER — Ambulatory Visit: Payer: Self-pay | Admitting: General Practice

## 2013-01-13 LAB — URINALYSIS, COMPLETE
Nitrite: NEGATIVE
Protein: NEGATIVE
RBC,UR: 16 /HPF (ref 0–5)
Specific Gravity: 1.023 (ref 1.003–1.030)

## 2013-01-13 LAB — CBC
MCH: 30.8 pg (ref 26.0–34.0)
MCHC: 34.7 g/dL (ref 32.0–36.0)
Platelet: 203 10*3/uL (ref 150–440)

## 2013-01-13 LAB — SEDIMENTATION RATE: Erythrocyte Sed Rate: 11 mm/hr (ref 0–20)

## 2013-01-13 LAB — BASIC METABOLIC PANEL
BUN: 24 mg/dL — ABNORMAL HIGH (ref 7–18)
EGFR (Non-African Amer.): 57 — ABNORMAL LOW

## 2013-01-13 LAB — PROTIME-INR
INR: 2
Prothrombin Time: 22 secs — ABNORMAL HIGH (ref 11.5–14.7)

## 2013-01-22 ENCOUNTER — Inpatient Hospital Stay: Payer: Self-pay | Admitting: General Practice

## 2013-01-22 LAB — PROTIME-INR: Prothrombin Time: 14.2 secs (ref 11.5–14.7)

## 2013-01-23 LAB — BASIC METABOLIC PANEL
Chloride: 105 mmol/L (ref 98–107)
EGFR (Non-African Amer.): >60

## 2013-01-23 LAB — PROTIME-INR: INR: 1.3

## 2013-01-24 LAB — CBC WITH DIFFERENTIAL/PLATELET
Basophil #: 0 10*3/uL (ref 0.0–0.1)
Eosinophil #: 0 10*3/uL (ref 0.0–0.7)
Eosinophil %: 0.3 %
HCT: 30.6 % — ABNORMAL LOW (ref 40.0–52.0)
MCH: 30.8 pg (ref 26.0–34.0)
MCHC: 34.4 g/dL (ref 32.0–36.0)
MCV: 89 fL (ref 80–100)
Neutrophil #: 9.2 10*3/uL — ABNORMAL HIGH (ref 1.4–6.5)
RDW: 13.5 % (ref 11.5–14.5)
WBC: 12.3 10*3/uL — ABNORMAL HIGH (ref 3.8–10.6)

## 2013-01-24 LAB — BASIC METABOLIC PANEL
BUN: 16 mg/dL (ref 7–18)
Co2: 27 mmol/L (ref 21–32)
Creatinine: 1.05 mg/dL (ref 0.60–1.30)
EGFR (African American): >60
Potassium: 4 mmol/L (ref 3.5–5.1)
Sodium: 136 mmol/L (ref 136–145)

## 2013-01-24 LAB — PLATELET COUNT: Platelet: 165 10*3/uL (ref 150–440)

## 2013-01-24 LAB — PROTIME-INR
INR: 1.4
Prothrombin Time: 17.5 secs — ABNORMAL HIGH (ref 11.5–14.7)

## 2013-01-25 LAB — PROTIME-INR: Prothrombin Time: 17.3 secs — ABNORMAL HIGH (ref 11.5–14.7)

## 2013-01-28 DIAGNOSIS — M169 Osteoarthritis of hip, unspecified: Secondary | ICD-10-CM

## 2013-01-28 DIAGNOSIS — E119 Type 2 diabetes mellitus without complications: Secondary | ICD-10-CM

## 2013-01-28 DIAGNOSIS — N400 Enlarged prostate without lower urinary tract symptoms: Secondary | ICD-10-CM

## 2013-01-28 DIAGNOSIS — I4891 Unspecified atrial fibrillation: Secondary | ICD-10-CM

## 2013-02-04 DIAGNOSIS — R197 Diarrhea, unspecified: Secondary | ICD-10-CM

## 2013-02-14 DIAGNOSIS — I959 Hypotension, unspecified: Secondary | ICD-10-CM

## 2013-02-19 DIAGNOSIS — E119 Type 2 diabetes mellitus without complications: Secondary | ICD-10-CM

## 2013-02-19 DIAGNOSIS — Z96649 Presence of unspecified artificial hip joint: Secondary | ICD-10-CM

## 2013-02-19 DIAGNOSIS — I4891 Unspecified atrial fibrillation: Secondary | ICD-10-CM

## 2013-02-19 DIAGNOSIS — Z471 Aftercare following joint replacement surgery: Secondary | ICD-10-CM

## 2013-04-14 ENCOUNTER — Encounter: Payer: Medicare Other | Admitting: *Deleted

## 2013-04-25 ENCOUNTER — Encounter: Payer: Self-pay | Admitting: *Deleted

## 2013-11-08 IMAGING — NM NUCLEAR MEDICINE THREE PHASE BONE SCAN
2 series · 8 of 8 positions shown · non-contrast
Comparison: none

REASON FOR EXAM: rt hip pain
COMMENTS:

PROCEDURE:     NM  - NM BONE IMAGING 3 PHASE STUDY  - December 02, 2012  [DATE]
RESULT:     Comparison: Right hip radiographs 07/13/2012, lumbar spine MRI
06/16/2010
Radiotracer: 23.5 mCi technetium 99m MDP
TECHNIQUE: Standard 3 phase bone scan was performed after the injection
ovary tracer material. SPECT CT images and whole-body planar images were
obtained at the delayed time point

[Series 1000: 3 hr delay bone statics · delayed · 2.40mm/px · 2 of 2 frames shown]
[frame 1/2]
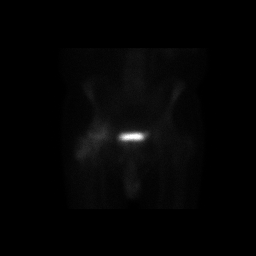
[frame 2/2]
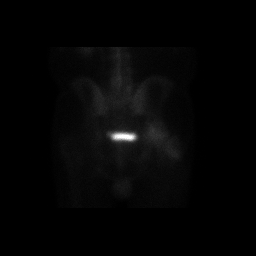

[Series 1000: bone (recon - ac ) · 4.8mm · 4.80mm/px · 6 of 78 frames shown]
[frame 7/78]
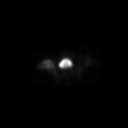
[frame 20/78]
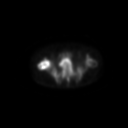
[frame 33/78]
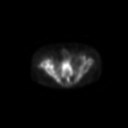
[frame 46/78]
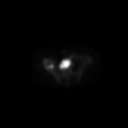
[frame 59/78]
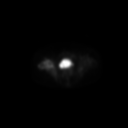
[frame 72/78]
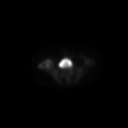

[8 of 8 positions shown; findings below may reference images not displayed]

FINDINGS: Angiographic phase images demonstrate relatively symmetric activity in the
pelvis and proximal femurs. On the equilibrium phase images, there is
increased radiotracer activity in the region of the right femoral head and
neck including the region of the greater trochanter. This persists on the
delayed phase whole body images. Evaluation of the SPECT CT images
demonstrates increased radiotracer activity in the region of the fixation
screws in the right femoral head, neck, an intertrochanter region. The 2
more superior screws appear to breech the medial and superior articular
surface. There is some focal activity along the medial aspect of the femoral
head near the articular surface, which is nonspecific. There is some lucency
surrounding the tips of the 2 superior most screws, which is nonspecific.

On the whole-body planar images, there is focal radiotracer activity in the
region of the L1 vertebral body. This could be related to residua of the
prior compression fracture. Acute superimposed abnormality cannot be
excluded. Small focus of activity in the right ankle is likely distended.
IMPRESSION: 1. There is abnormal radiotracer activity in the region of the right femoral
head and neck. This could related to physiologic activity from the prior
fracture and internal fixation. Hardware loosening or infection cannot be
excluded in the appropriate clinical setting. There is some nonspecific
activity along the medial aspect of the femoral head near the articular
surface.   There is some lucency surrounding the tips of the 2 more superior
screws which appear to breech the superior and medial articular surface of
the femoral head.
2. There is focal abnormal activity in the region of the L1 vertebral body.
This could related to residua of the previously seen compression fracture.
However, superimposed acute fracture or underlying abnormality is not
excluded. Further evaluation could be provided with MRI, as indicated.

[REDACTED]

## 2014-01-27 ENCOUNTER — Encounter: Payer: Self-pay | Admitting: *Deleted

## 2014-05-05 ENCOUNTER — Encounter: Payer: Self-pay | Admitting: Internal Medicine

## 2014-05-14 ENCOUNTER — Encounter (HOSPITAL_COMMUNITY): Payer: Self-pay | Admitting: Internal Medicine

## 2014-06-05 ENCOUNTER — Encounter: Payer: Self-pay | Admitting: Internal Medicine

## 2014-09-25 NOTE — Discharge Summary (Signed)
PATIENT NAME:  Raymond Navarro, Raymond Navarro MR#:  [id number] DATE OF BIRTH:  Aug 18, 1928  DATE OF ADMISSION:  07/14/2012 DATE OF DISCHARGE: 07/18/2012    DISCHARGE DIAGNOSES:  1.  Right hip intertrochanteric fracture.  2.  Atrial fibrillation.  3.  Diabetes mellitus, non-insulin-requiring.  4.  Chronic atrial fibrillation.   5.  Hyperlipidemia.  6.  Sick sinus syndrome, post pacemaker.   DISCHARGE MEDICATIONS: Glucophage 500 mg daily, glimepiride 4 mg b.i.d., diltiazem 360 mg daily, pravastatin 20 mg at bedtime, metoprolol 25 mg b.i.d., Coumadin 5 mg at bedtime, vitamin D3 2000 units daily, vitamin B complex daily, multivitamin daily, Flomax 0.4 mg daily, Percocet 5/325 mg 1 q. 4 hours p.r.n., Dulcolax 10 mg rectal suppository daily p.r.n. and Protonix 40 mg daily.   REASON FOR ADMISSION: An 81 year old male presents with right hip fracture. Please see history of present illness, past medical history, and physical exam.   HOSPITAL COURSE: The patient was admitted. Coumadin was reversed. Hemoglobin is stable. Hart rate and hemodynamics stable. Underwent right hip pinning by Dr. Marry Guan. No postoperative complications. Coumadin restarted. Pro time and INR 1.3 on discharge. He will be going to skilled nursing. Pro time and 5 days. Overall prognosis is good.    ____________________________ Rusty Aus, MD mfm:aw D: 07/18/2012 10:44:30 ET T: 07/18/2012 11:01:18 ET JOB#: [id number]  cc: Rusty Aus, MD, <Dictator> Rusty Aus MD ELECTRONICALLY SIGNED 07/18/2012 11:23

## 2014-09-25 NOTE — Consult Note (Signed)
PATIENT NAME:  Raymond Navarro, Raymond MR#:  Navarro DATE OF BIRTH:  07/22/1928  DATE OF CONSULTATION:  01/22/2013  REFERRING PHYSICIAN:  Skip Estimable from [facility] CONSULTING PHYSICIAN:  Dion Body, MD  REASON FOR CONSULTATION: Tachycardia.   HISTORY OF PRESENT ILLNESS: This is an 81 year old male who was admitted via [facility] on 01/22/2013 with ongoing right hip pain status post fracture earlier this year. He underwent right total hip arthroplasty today. Post surgery, he was noted to have tachycardia when resting in his room. Heart rate got up to the 130s to 150s. The patient remained asymptomatic, denied any chest pain. I was called by Dr. Duayne Cal to evaluate the patient with a history of atrial fibrillation, on diltiazem and metoprolol. He has yet to receive his diltiazem today and he has yet to receive his metoprolol this evening.   PAST MEDICAL HISTORY 1.  Hypertension.  2.  Diabetes.  3.  Hyperlipidemia.  4.  History of chronic atrial fibrillation.   PAST SURGICAL HISTORY 1.  Tonsillectomy.  2.  Gallbladder removal.  3.  Appendectomy.  4. Vasectomy.  5.  Bone repair in the toe.  6.  Percutaneous pinning of the right femoral neck and right total hip arthroplasty.   HOME MEDICATIONS:  Include:  1.  Metformin extended-release 500 mg p.o. b.i.d.  2.  Pravastatin 20 mg p.o. at bedtime.  3.  Glimepiride 4 mg p.o. b.i.d.  4.  Warfarin 4 mg as directed.  5.  Diltiazem 360 mg p.o. daily.  6.  Metoprolol 25 mg p.o. b.i.d.  7.  Ultram as needed.  8.  Saline nasal spray as needed.  9.  Flomax 0.4 mg 1 tab daily.   ALLERGIES: NIACIN WHICH CAUSES ITCHING.   SOCIAL HISTORY:  Married, no tobacco use, no alcohol use.   FAMILY HISTORY: Noncontributory.   REVIEW OF SYSTEMS: No fevers, no chest pain, no dyspnea or palpitations.   PHYSICAL EXAMINATION VITAL SIGNS: Temperature 100.5, pulse 132, blood pressure 119/82, respiratory rate 20, satting 94% on room air.  GENERAL: Well  appearing Caucasian male in no apparent distress, alert and oriented x 3.  HEENT: Extraocular movements intact. Pupils equal and reactive to light and accommodation.  NECK: Supple neck exam.  CARDIOVASCULAR: Tachycardic. Difficult to delineate between irregular and regular rhythm because of rate of the heart rate.  RESPIRATORY: Clear to auscultation bilaterally. No increased work of breathing.  NEURO EXAM:  Cranial nerves II through XII grossly intact. No focal weakness or neurological deficits.  SKIN: Normal color. Normal turgor. No cyanosis.   PERTINENT LABS: INR 1.1.   ASSESSMENT AND PLAN: This is a 81 year old male status post right total hip arthroplasty with tachycardia.  1.  Tachycardia, most likely consistent with atrial flutter. EKG is pending at this time. Also has a history of atrial fibrillation. The patient remains asymptomatic at this time. No chest pain or palpitations noted per patient. Coumadin has been restarted. Plan is to change the diltiazem to 90 mg every 6 hours p.o. and to provide the metoprolol 25 mg this evening. Will place on telemetry and monitor patient for any symptoms. He does have a pacemaker in place. If he does not improve his rate, we may consider switching him over to IV diltiazem.   2.  Other chronic medical issues remain stable at this time. No changes to his home regimen.   DISPOSITION: He is in fair condition status post surgery. I will follow him as long as he is hospitalized.  ____________________________ Dion Body, MD kl:cs D: 01/22/2013 19:35:35 ET T: 01/22/2013 19:51:45 ET JOB#: [id number]  cc: Dion Body, MD, <Dictator> Dion Body MD ELECTRONICALLY SIGNED 02/24/2013 10:47

## 2014-09-25 NOTE — Consult Note (Signed)
PATIENT NAME:  Raymond Navarro, Raymond Navarro MR#:  [id number] DATE OF BIRTH:  1928-12-27  DATE OF CONSULTATION:  07/14/2012  REFERRING PHYSICIAN:  Durene Cal, MD. CONSULTING PHYSICIAN:  Isaias Cowman, MD  PRIMARY CARE PHYSICIAN: Emily Filbert, MD.  CHIEF COMPLAINT: "I fractured my hip."   REASON FOR CONSULTATION: Consultation requested for preoperative cardiovascular evaluation prior to surgery.   HISTORY OF PRESENT ILLNESS: The patient is an 81 year old gentleman with known history of sick sinus syndrome, status post pacemaker and chronic atrial flutter. The patient was in his usual state of health until 07/13/2012 when he tripped in his bedroom, fell and fractured his right hip. Prior to the event, the patient  was asymptomatic, doing well, denying chest pain, shortness of breath, presyncope or syncope. Admission EKG revealed atrial flutter with a controlled rate.   PAST MEDICAL HISTORY:  1.  Chronic atrial flutter.  2.  Sick sinus syndrome, status post pacemaker 03/2012.  3.  Hyperlipidemia.  4.  Type 2 diabetes.   MEDICATIONS: Warfarin 5 mg daily, Cardizem CD 360 mg daily, metoprolol 25 mg daily, Amaryl 4 mg b.i.d.,  metformin 500 mg b.i.d. and vitamin D 2000 international units b.i.d.   SOCIAL HISTORY: The patient is married. He is a retired Company secretary. He lives with his wife at [facility]. He denies tobacco abuse.   FAMILY HISTORY: Father died at age 59, status post myocardial infarction.   REVIEW OF SYSTEMS:   CONSTITUTIONAL: No fever or chills.  EYES: No blurry vision.  EARS: No hearing loss.  RESPIRATORY: No shortness of breath.  CARDIOVASCULAR: No chest pain, orthopnea, PND, pedal edema, presyncope or syncope.  GASTROINTESTINAL: No nausea, vomiting, diarrhea or constipation.  GENITOURINARY: No dysuria or hematuria.  ENDOCRINE: No polyuria or polydipsia.  INTEGUMENTARY: No rash.  MUSCULOSKELETAL: No arthralgias or myalgias.  NEUROLOGICAL: No focal muscle weakness or numbness.   PSYCHOLOGICAL: No depression or anxiety.   PHYSICAL EXAMINATION:  VITAL SIGNS: Blood pressure 157/87, pulse 110, respirations 17, temperature 98.1 and pulse oximetry 90%.  HEENT: Pupils equal, reactive to light and accommodation.  NECK: Supple without thyromegaly.  LUNGS: Clear.  HEART: Normal JVP. Normal PMI. Regular rate and rhythm. Normal S1, S2. No appreciable gallop, murmur, or rub.  ABDOMEN: Soft and nontender. Pulses were intact bilaterally.  MUSCULOSKELETAL: Notable for right hip fracture.  NEUROLOGIC: The patient is alert and oriented x 3. Motor and sensory both grossly intact.   IMPRESSION: This is an 81 year old gentleman with history of sick sinus syndrome, status post pacemaker with chronic atrial flutter, currently clinically stable from a cardiovascular perspective. The patient awaiting surgery for right hip fracture. The patient has a history of diabetes, but otherwise does not have any high risk features for cardiovascular complication. The patient denies history of prior myocardial infarction, congestive heart failure, prior cerebrovascular accident, chronic renal insufficiency or chronic renal disease. The patient would be a low risk for serious cardiovascular complication during surgery.   RECOMMENDATIONS:  1.  Agree with overall current therapy.  2.  Would continue metoprolol pre-, peri- and postoperatively.  3.  Defer any further cardiac diagnostics at this time.  4.  Agree with holding warfarin prior to surgery.  5.  Proceed with hip surgery as planned.     ____________________________ Isaias Cowman, MD ap:aw D: 07/14/2012 10:00:49 ET T: 07/14/2012 10:25:06 ET JOB#: [id number]  cc: Isaias Cowman, MD, <Dictator> Isaias Cowman MD ELECTRONICALLY SIGNED 07/30/2012 14:59

## 2014-09-25 NOTE — Consult Note (Signed)
PATIENT NAME:  Raymond Navarro, Raymond Navarro MR#:  [id number] DATE OF BIRTH:  12-05-1928  DATE OF PROGRESS NOTE:  07/14/2012  REFERRING PHYSICIAN:   CONSULTING PHYSICIAN:  Maebelle Munroe, MD  The patient has a right femoral neck fracture.   He has requested that Dr. Marry Guan manage his care. I had written orders for him to be admitted to the hospital and the hospitalist has seen him.  Dr. Emily Filbert has already seen the patient and discussed surgical management with him. I will be signing off the case as the family is satisfied with the care from him. The plan is to proceed with surgery once his INR is appropriate.     ____________________________ Maebelle Munroe, MD jfs:aw D: 07/14/2012 12:06:49 ET T: 07/14/2012 12:34:42 ET JOB#: [id number]  cc: Maebelle Munroe, MD, <Dictator> Maebelle Munroe MD ELECTRONICALLY SIGNED 07/14/2012 13:03

## 2014-09-25 NOTE — Consult Note (Signed)
PATIENT NAME:  Raymond Navarro, Raymond Navarro MR#:  [id number] DATE OF BIRTH:  19-Feb-1929  DATE OF CONSULTATION:  07/14/2012  REFERRING PHYSICIAN: Dr. Durene Cal   CONSULTING PHYSICIAN:  Dr. Clovis Pu. Jalexia Lalli  PRIMARY CARE PHYSICIAN: Dr. Emily Filbert  CARDIOLOGIST: Dr. Serafina Royals   CHIEF COMPLAINT: Right hip fracture status post fall.   HISTORY OF PRESENT ILLNESS: Raymond Navarro is an 81 year old pleasant Caucasian male who was in his usual state of health until last evening when the side of his foot caught against the corner of the bed resulting in a fall and fracturing his hip. His wife was in his residential area, and she came to see him and she helped him to use his walker, and they drove the car coming here to the Emergency Department where an x-ray revealed evidence of right hip fracture. The patient denies having any other her symptoms or discomfort. Denies any chest pain, no palpitations. No syncope, no loss of consciousness. No fever. No cough. No chest pain. No shortness of breath. No abdominal pain or vomiting or diarrhea.   REVIEW OF SYSTEMS:  CONSTITUTIONAL: Denies any fever. No chills. No fatigue.  EYES: No blurring of vision. No double vision.  ENT: No hearing impairment. No sore throat. No dysphagia.  CARDIOVASCULAR: No chest pain. No shortness of breath. No edema. No syncope.  RESPIRATORY: No shortness of breath. No cough. No hemoptysis.  GASTROINTESTINAL: No abdominal pain, no vomiting, no diarrhea.  GENITOURINARY: No dysuria. No frequency of urination.  MUSCULOSKELETAL: Other than the right hip pain, he has no other pain. No joint swelling. No muscular pain or swelling.  INTEGUMENTARY: No skin rash. No ulcers.  NEUROLOGY: No focal weakness. No seizure activity. No headache.  PSYCHIATRY: No anxiety. No depression.  ENDOCRINE: No polyuria or polydipsia. No heat or cold intolerance.  HEMATOLOGY: No easy bruisability. No lymph node enlargement.   PAST MEDICAL HISTORY:  1. Systemic  hypertension.  2. Cardiomyopathy with ejection fraction of 50%, mild mitral regurgitation, and aortic regurgitation and moderate tricuspid regurgitation.  3. Sick sinus syndrome, status post pacemaker insertion of October 2013.  4. Hyperlipidemia.  5. Diabetes mellitus, type 2.  6. History of L1 compression fracture.  7. Benign prostatic hypertrophy.  8. Atrial flutter on chronic anticoagulation with Coumadin.   PAST SURGICAL HISTORY:  1. Pacemaker insertion in October 2013.  2. Cholecystectomy.  3. Appendectomy. 4. Thyroidectomy. 5. Tonsillectomy.   SOCIAL HABITS: Nonsmoker. No history of alcohol or drug abuse.   FAMILY HISTORY: His father died at the age of 105 after having a myocardial infarction. His mother died after a struggle with uterine cancer.   SOCIAL HISTORY: The patient is a retired Company secretary. He is married and living with his wife. He tells me that he has a Living Will, and his CODE STATUS is DO NOT RESUSCITATE.   ADMISSION MEDICATIONS: Amaryl 4 mg twice a day, metformin 500 mg twice a day, Cardizem long-acting 360 mg once a day, metoprolol 25 mg twice a day, pravastatin 20 mg a day, warfarin or Coumadin 5 mg a day, Flomax 0.4 mg once a day, vitamin D 2000 units once to twice a day, fish oil and cinnamon.   ALLERGIES: NIACIN.  PHYSICAL EXAMINATION: VITAL SIGNS: Blood pressure 135/63, respiratory rate 16, pulse 68, and O2 saturation 92%.  GENERAL APPEARANCE: Elderly male, thin looking, lying in bed in no acute distress.  HEENT: Head: No pallor. No icterus. No cyanosis. ENT: Ear examination revealed normal hearing, no discharge, no lesions.  Nasal mucosa was normal, no discharge, no bleeding. No ulcers. Oropharyngeal area was normal without oral thrush, no ulcers. Eye examination revealed normal eyelids and conjunctivae. Both pupils are constricted, and I could not see reactivity to light.  NECK: Supple. Trachea at midline. No thyromegaly. No cervical lymphadenopathy or   masses.  HEART: Regular S1, S2. No S3 or S4. No murmur. No gallop. No carotid bruits. He has good distal pulses on the left leg with dorsalis pedis +2 to +3. However, he has poor  distal pulses at the right foot.  RESPIRATORY: The patient's breathing pattern is normal without using accessory muscles. No rales. No wheezing.  ABDOMEN: Soft without tenderness. No hepatosplenomegaly. No masses. No hernias.  SKIN: No ulcers. No subcutaneous nodules.  MUSCULOSKELETAL: No joint swelling. No clubbing.  NEUROLOGIC: Cranial nerves II through XII are intact. No focal motor deficit.  PSYCHIATRIC: The patient is alert and oriented x3. Mood and affect were normal.   LABORATORY FINDINGS AND RADIOLOGIC DATA:  Chest x-ray showed no acute cardiopulmonary abnormalities. The right hemidiaphragm was noted to be elevated, and there is evidence of a pacemaker on the left side chest wall. X-ray of the right hip shows evidence of fracture. EKG showed atrial flutter with controlled ventricular rate at 74 per minute. Nonspecific T wave abnormalities. In the last EKG to compare was in January 2012.  At that time the patient was in sinus rhythm, and at that time he did not have these nonspecific ST-T wave abnormalities.   Blood work-up includes a glucose of 154, BUN 18, creatinine 0.6, sodium 137, potassium 4.2. Magnesium was 1.5, calcium was 8.7.  Liver function tests and liver transaminases were normal. CBC showed a white count of 11,000, hemoglobin 14, hematocrit 42, platelet count 190. Prothrombin time 26, INR 2.4, aPTT 30.   ASSESSMENT: 1. Right hip fracture, status post fall.  2. Atrial flutter with controlled ventricular rate on chronic anticoagulation with Coumadin. The patient is adequately anticoagulated.  3. Systemic hypertension.  4. Cardiomyopathy with ejection fraction of 50%.  5. Diabetes mellitus, type 2.  6. Hyperlipidemia.  7. Sick sinus syndrome, status post pacemaker insertion.  8. Benign prostatic  hypertrophy.   PLAN: The patient is at moderate cardiovascular risk for his surgery given his advanced age and underlying cardiac arrhythmia with atrial flutter, hypertension, diabetes and evidence of mild cardiomyopathy. Coumadin will be on hold. If there is a decision for doing surgery earlier, one might transfuse 2 units of fresh frozen plasma if this is desired to reverse the prothrombin time. In the interim, I will consult the patient's cardiologist, Dr. Nehemiah Massed.   CODE STATUS: I would like to mention that the patient tells me that he has a Living Will and that his CODE STATUS is DO NOT RESUSCITATE; however, he is accepting to reverse this decision during the surgery.   TIME SPENT WITH THE PATIENT: More than 25 minutes. I also reviewed his medical records. This is not included in the above time that I mentioned.    ____________________________ Clovis Pu. Lenore Manner, MD amd:cb D: 07/14/2012 02:20:30 ET T: 07/14/2012 07:07:11 ET JOB#: [id number]  cc: Clovis Pu. Lenore Manner, MD, <Dictator> Ellin Saba MD ELECTRONICALLY SIGNED 07/15/2012 22:46

## 2014-09-25 NOTE — Discharge Summary (Signed)
PATIENT NAME:  Raymond Navarro, Raymond Navarro MR#:  [id number] DATE OF BIRTH:  03-Mar-1929  DATE OF ADMISSION:  01/22/2013 DATE OF DISCHARGE:  01/25/2013   DICTATING FOR: Jeneen Rinks P. Holley Bouche., MD  ADMITTING DIAGNOSIS: Degenerative arthrosis of right hip.   DISCHARGE DIAGNOSES:  1. Degenerative arthrosis of right hip.  2. Atrial flutter.   CONSULTATIONS:  1. Dr. Dion Body, family practitioner. 2. Dr. Emily Filbert.   HISTORY: The patient is an 81 year old who has been followed at [facility] for progression of right hip pain. The patient was status post right hip percutaneous pinning of the right femoral neck fracture done earlier in this year. He was noted initially to do very well, but had been having increasing right groin pain over the last several weeks prior to surgery. His pain was noted to be aggravated to the point that he had gone to using a walker for ambulation. He had also stated that the pain was generally with weight-bearing, activity-related. He had also had some increased pain with certain rotations of the hip. He denied any fevers or any chills. He also denied any buttocks or pain radiating down the leg. The patient did have a bone scan performed on 12/02/2012 which showed abnormal tracer to the right femoral neck and head. Hardware loosening or infection could not be excluded. Due to the fact that he was having increasing discomfort and his activities of daily living have been significantly limited, surgery was recommended. After discussion of the risks and benefits of surgical intervention, the patient expressed his understanding of the risks and benefits and agreed for plans for surgical intervention.   PROCEDURE: Removal of three 7.3 mm cannulated cancellous screws, with conversion to a right total hip arthroplasty.   ANESTHESIA: Spinal.   IMPLANTS UTILIZED: DePuy 15 mm small stature AML femoral component, 52 mm outer diameter Pinnacle Gription sector acetabular component, +4 mm  neutral Pinnacle Marathon polyethylene liner, and a 36 mm M-Spec femoral head with a +1.5 mm neck length.   HOSPITAL COURSE: The patient tolerated the procedure very well. He had no complications. He was then taken to the PACU, where he was start on Coumadin 5 mg usual schedule, Monday, Wednesday and Friday at 5:00 p.m. and 4 mg on Tuesday, Thursday, Saturday and Sunday. He was also fitted with TED stockings bilaterally. These were allowed to be removed 1 hour per 8-hour shift. The patient was fitted with AVI compression foot pumps bilaterally set at 80 mmHg. His calves have been nontender. There has been no evidence of any DVTs. Negative Homans sign. Heels were elevated off the bed using rolled towels.   The patient has denied any chest pains or shortness of breath. His vital signs have been stable except for tachycardia the first afternoon after surgery. Dr. Netty Starring was contacted. The patient was asymptomatic. Dr. Netty Starring evaluated the patient with a heart rate of 130 to 150. The patient has a history of atrial fibrillation and was on diltiazem and metoprolol. The patient had not received his diltiazem at the time of his tachycardia. He had not had his metoprolol as well. After the adjustment of the medication, his atrial flutter cleared, and he has been asymptomatic and back to his normal baseline following the adjustment of his medication. The patient has been afebrile. Hemodynamically, he was stable. No transfusions were given.   Physical therapy was initiated on day 1 for gait training and transfers. He has done very well. He has had no complications. Occupational therapy was also  initiated on day 1 for ADLs and assistive devices. The patient's IV, Foley and Hemovac were discontinued on day 2 along with the dressing change. The wound was free of any drainage or any signs of infection.   DISPOSITION: The patient is being discharged to [facility] facility in improved stable condition.   DISCHARGE  INSTRUCTIONS:  1. He may weight bear as tolerated.  2. Continue TED stockings bilaterally. These are allowed to be removed 1 hour per 8-hour shift.  3. Continue to use a walker until cleared by physical therapy to go to a quad cane.  4. He was instructed on hip precautions.  5. Elevate heels off the bed.  6. Incentive spirometer q.1 hour while awake. Encourage deep breathing q.2 hours while awake.  7. He is placed on a regular diet.  8. PT for gait training and transfers. OT for ADLs and assistive devices.  9. He has a followup appointment on October 2nd at 10:15.  10. Staples are to be removed on September 3 and apply benzoin and Steri-Strips.  11. He is not to take a shower until the staples are removed.  12. Change dressing as needed.   DRUG ALLERGIES: NIACIN.   MEDICATIONS:  1. Diltiazem 90 mg q.6 hours.  2. Senokot-S 1 tablet b.i.d. 3. Metoprolol 25 mg b.i.d. 4. Pantoprazole 40 mg b.i.d.  5. Pravachol 20 mg at bedtime.  6. Flomax 0.4 mg at bedtime.  7. Coumadin 5 mg usual schedule Monday, Wednesday and Friday at 5:00 and Tuesday, Thursday, Saturday and Sunday 4 mg at 5:00.  8. Amaryl 4 mg b.i.d. 9. Insulin sliding scale Novolin R.  10. Glucophage 50 mg b.i.d. with meal.  11. Tylenol ES 500 to 1000 mg q.4 hours p.r.n. for pain and temperatures of greater than 100.4. 12. Mylanta DS 30 mL q.6 hours p.r.n. 13. Dulcolax suppository 10 mg rectally p.r.n. 14. Milk of Magnesia 30 mL b.i.d. p.r.n.  15. Roxicodone 5 to 10 mg q.4-6 hours p.r.n. for pain. 16. Tramadol 50 to 100 mg q.4-6 hours p.r.n. for pain.  17. Enema soapsuds if no results with Milk of Magnesia or Dulcolax.   PAST MEDICAL HISTORY:  1. Hypertension.  2. Diabetes.  3. Hyperlipidemia.  4. Chronic atrial fibrillation.  5. Vitamin D deficiency.   ____________________________ Vance Peper, PA jrw:OSi D: 01/24/2013 07:38:00 ET T: 01/24/2013 08:07:05 ET JOB#: [id number]  cc: Vance Peper, PA, <Dictator> Daelyn Mozer  PA ELECTRONICALLY SIGNED 01/26/2013 7:37

## 2014-09-25 NOTE — Consult Note (Signed)
Brief Consult Note: Diagnosis: AFL, rate controlled, low risk for serious CV complication during hip surgery.   Patient was seen by consultant.   Consult note dictated.   Comments: REC  Agree with current therapy, cont cardizem and metoprolol for rate control, agree holding warfarin for surgery, defer further cardiac diagnostics, proceed with surgery.  Electronic Signatures: Isaias Cowman (MD)  (Signed 09-Feb-14 10:18)  Authored: Brief Consult Note   Last Updated: 09-Feb-14 10:18 by Isaias Cowman (MD)

## 2014-09-25 NOTE — Op Note (Signed)
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   PROCEDURE PERFORMED: Removal of three 7.3 mm cannulated cancellous screws, and conversion to a right total hip arthroplasty.   SURGEON: Dr. Skip Estimable.  ASSISTANT:  Vance Peper, PA (required to maintain retraction throughout the procedure).   ANESTHESIA: Spinal.   ESTIMATED BLOOD LOSS: 250 mL.   FLUIDS REPLACED: 1700 mL of crystalloid.   DRAINS: Two medium drains to Hemovac reservoir.   IMPLANTS UTILIZED: DePuy 15 mm small-stature AML femoral component, 52 mm outer diameter Pinnacle Gription Sector acetabular component, +4 mm neutral Pinnacle Marathon polyethylene liner, and a 36 mm M-SPEC femoral head with a +1.5 mm neck length.   INDICATIONS FOR SURGERY: The patient is an 81 year old male who underwent percutaneous pinning of a right femoral neck fracture approximately 6-1/2 months ago. He did well initially, with evidence of healing of the fracture. However, he had the onset of right groin pain with weightbearing activities, and there was a concern of the avascular necrosis of the femoral head.   After discussion of the risks and benefits of surgical intervention, the patient expressed understanding of the risks and benefits and agreed with plans for surgical intervention.   PROCEDURE IN DETAIL: The patient was brought to the operating room, and after adequate spinal anesthesia was achieved the patient was placed in the left lateral decubitus position. An axillary roll was placed and all bony prominences were well-padded. The patient's right hip and leg were cleaned and prepped with alcohol and DuraPrep and draped in the usual  sterile fashion. A "time-out" was performed as per usual protocol. A lateral curvilinear incision was made gently curving towards the posterior superior iliac spine. IT band was incised in-line with the skin incisions. Fibers of the gluteus maximus were split in-line. Piriformis tendon was identified, skeletonized, and incised at its insertion, and the proximal femur reflected posteriorly. In a similar fashion, short external rotators were incised and reflected posteriorly. A T-type posterior capsulotomy was performed.   Next, the vastus lateralis was split after palpating the screw heads, and the three 7.3 mm cannulated partially-threaded cancellous screws were removed without difficulty. Fascia was repaired using 0 Vicryl. A threaded Steinmann pin was inserted through a separate stab incision into the pelvis superior to the acetabulum, then bent in the form of a stylus so as to assess limb length and hip offset throughout the procedure.   The femoral head was then dislocated posteriorly. A femoral neck cut was performed using an oscillating saw. Inspection of the femoral head demonstrated irregularity to the articular cartilage, with findings consistent with avascular necrosis and collapse of the subchondral bone. The capsule was elevated off of the femoral neck. Inspection of the acetabulum demonstrated degenerative changes. A remnant of the labrum was excised. The acetabulum was reamed in a sequential fashion up to a 51 mm diameter. This allowed for good, punctate bleeding bone. A  52 mm outer diameter Pinnacle Gription Sector acetabular component was positioned and impacted into place. Good scratch-fit was appreciated. A +4 neutral polyethylene trial was inserted, and attention was directed to the proximal femur.   A pilot hole for reaming of the proximal femoral canal was created using a high-speed bur. Proximal femoral canal was reamed  in a sequential fashion up to a 14.5 mm diameter. This allowed for  approximately 5.5 to 6 cm of scratch-fit. Proximal femur was prepared using a  50 mm aggressive side-biting reamer. Serial broaches were inserted up to a 15 mm small-stature broach. The calcar region was planed and trial reduction was performed with a 36 mm hip ball with a +1.5 mm neck length. Good equalization of limb lengths was appreciated and improved hip offset was noted. Excellent stability was noted both anteriorly and posteriorly.   The femoral head was then dislocated posteriorly. Trial components were removed. A +4 mm neutral Pinnacle Marathon polyethylene liner was positioned and impacted into place. Next,  a 15 mm small-stature AML femoral component was positioned and impacted into place. Excellent scratch-fit was appreciated. Trial reduction was again performed with a 36 mm hip ball with a +1.5 mm neck length. Again, good equalization of limb lengths was noted. Excellent stability appreciated both anteriorly and posteriorly. The femoral head trial was removed. The Morse taper was cleaned and dried. A 36 mm outer diameter M-SPEC hip ball with +1.5 mm neck length was placed on the trunnion and impacted into place. The hip was reduced and placed through a range of motion. Again, excellent stability was appreciated, with good equalization of limb lengths and improved hip offset.   The wound was irrigated with copious amounts of normal saline with antibiotic solution using pulsatile lavage and then suctioned dry. Good hemostasis was appreciated. The posterior capsulotomy was repaired using a #5 Ethibond. The piriformis tendon was reapproximated on the undersurface gluteus medius tendon using 5-0 Ethibond. Two medium drains were placed in the wound bed and brought through a separate stab incisions to be attached to a Hemovac reservoir. The IT band was repaired using interrupted sutures of #1 Vicryl. The subcutaneous tissue was approximated in layers using, first #0 Vicryl, followed by #2-0 Vicryl. Skin  was closed with skin staples. A sterile dressing was applied.   The patient tolerated the procedure well. He was transported to the recovery room in stable condition.    ____________________________ Laurice Record. Holley Bouche., MD jph:dm D: 01/22/2013 11:12:06 ET T: 01/22/2013 12:13:36 ET JOB#: [id number]  cc: Jeneen Rinks P. Holley Bouche., MD, <Dictator> JAMES P Holley Bouche MD ELECTRONICALLY SIGNED 02/12/2013 7:11

## 2014-09-25 NOTE — Op Note (Signed)
PATIENT NAME:  Raymond Navarro, Raymond Navarro MR#:  [id number] DATE OF BIRTH:  01/28/1929  DATE OF PROCEDURE:  07/16/2012  PREOPERATIVE DIAGNOSIS:  Right femoral neck fracture.   POSTOPERATIVE DIAGNOSIS:  Right femoral neck fracture.  PROCEDURE PERFORMED:  Percutaneous pinning of right femoral neck fracture.   SURGEON:  Laurice Record. Hooten, M.D.   ANESTHESIA:  Spinal.   ESTIMATED BLOOD LOSS:  Minimal.   FLUIDS REPLACED:  800 mL of crystalloid.   DRAINS:  None.   IMPLANTS UTILIZED:  Synthes 7.3 mm cannulated partially threaded cancellus screws x 3.    INDICATIONS FOR SURGERY:  The patient is an 81 year old gentleman who fell on 07/14/2012 and sustained an impacted right femoral neck fracture.  Surgery was delayed due to patient's elevated INR secondary to Coumadin anticoagulation.  After normalization of the INR, it was felt that patient was medically stable to proceed with surgical intervention.  Recommendation is made for percutaneous pinning of the right femoral neck fracture.  Risks and benefits of surgical intervention were discussed with the patient and his family.  They expressed their understanding of the risks and benefits and agreed with plans for surgical intervention.   PROCEDURE IN DETAIL:  The patient was brought into the operating room, and after adequate spinal anesthesia was achieved, the patient was transferred to the fracture table.  Right lower extremity was placed in gentle traction and all bony prominences were well-padded.  Position of the right hip was confirmed using AP and lateral views with the C-arm.  The patient's right hip and leg were cleaned and prepped with alcohol and DuraPrep, draped in the usual sterile fashion.  A "timeout" was performed as per usual protocol.  A small incision was made along the lateral aspect of the right hip.  A distally threaded guide pin was inserted into the femoral neck and head, then an inferior position with position confirmed in both AP and lateral  planes.  The guide was then placed over the guidepin and a superior and anterior and superior and posterior guidepin were inserted.  Position was confirmed in both AP and lateral planes.  Measurements were obtained and three 7.3 mm partially threaded cannulated cancellus screws were inserted over the guidewires.  Guidewire was removed and position was confirmed in both AP and lateral planes using the C-arm.  Good position was noted.  Wound was irrigated with copious amounts of normal saline with antibiotic solution.  Fascia was reapproximated using #1 Vicryl.  Subcutaneous tissue was approximated using first #0 Vicryl followed by #2-0 Vicryl.  Skin was closed with skin staples.  A sterile dressing was applied.   The patient tolerated the procedure well.  He was transported to the recovery room in stable condition.     ____________________________ Laurice Record. Holley Bouche., MD jph:ea D: 07/16/2012 23:24:12 ET T: 07/17/2012 04:21:34 ET JOB#: [id number]  cc: Laurice Record. Holley Bouche., MD, <Dictator> Laurice Record Holley Bouche MD ELECTRONICALLY SIGNED 07/18/2012 10:44

## 2015-04-26 ENCOUNTER — Emergency Department
Admission: EM | Admit: 2015-04-26 | Discharge: 2015-04-28 | Disposition: A | Discharge status: Home / Self Care | Payer: Medicare Other | Attending: Emergency Medicine | Admitting: Emergency Medicine

## 2015-04-26 ENCOUNTER — Encounter: Payer: Self-pay | Admitting: Intensive Care

## 2015-04-26 DIAGNOSIS — Z79899 Other long term (current) drug therapy: Secondary | ICD-10-CM | POA: Diagnosis not present

## 2015-04-26 DIAGNOSIS — R42 Dizziness and giddiness: Secondary | ICD-10-CM | POA: Diagnosis not present

## 2015-04-26 DIAGNOSIS — E119 Type 2 diabetes mellitus without complications: Secondary | ICD-10-CM | POA: Insufficient documentation

## 2015-04-26 DIAGNOSIS — R55 Syncope and collapse: Secondary | ICD-10-CM | POA: Insufficient documentation

## 2015-04-26 DIAGNOSIS — I10 Essential (primary) hypertension: Secondary | ICD-10-CM | POA: Insufficient documentation

## 2015-04-26 DIAGNOSIS — Z7901 Long term (current) use of anticoagulants: Secondary | ICD-10-CM | POA: Diagnosis not present

## 2015-04-26 DIAGNOSIS — Z950 Presence of cardiac pacemaker: Secondary | ICD-10-CM | POA: Insufficient documentation

## 2015-04-26 HISTORY — DX: Parkinson's disease without dyskinesia, without mention of fluctuations: G20.A1

## 2015-04-26 HISTORY — DX: Parkinson's disease: G20

## 2015-04-26 LAB — COMPREHENSIVE METABOLIC PANEL
ALT: 11 U/L — ABNORMAL LOW (ref 17–63)
ANION GAP: 8 (ref 5–15)
AST: 21 U/L (ref 15–41)
Albumin: 3.4 g/dL — ABNORMAL LOW (ref 3.5–5.0)
Alkaline Phosphatase: 57 U/L (ref 38–126)
BUN: 24 mg/dL — ABNORMAL HIGH (ref 6–20)
CHLORIDE: 99 mmol/L — AB (ref 101–111)
CO2: 23 mmol/L (ref 22–32)
Calcium: 8.1 mg/dL — ABNORMAL LOW (ref 8.9–10.3)
Creatinine, Ser: 1.08 mg/dL (ref 0.61–1.24)
Glucose, Bld: 267 mg/dL — ABNORMAL HIGH (ref 65–99)
POTASSIUM: 4.1 mmol/L (ref 3.5–5.1)
SODIUM: 130 mmol/L — AB (ref 135–145)
Total Bilirubin: 0.8 mg/dL (ref 0.3–1.2)
Total Protein: 5.7 g/dL — ABNORMAL LOW (ref 6.5–8.1)

## 2015-04-26 LAB — CBC
HCT: 38.7 % — ABNORMAL LOW (ref 40.0–52.0)
Hemoglobin: 12.9 g/dL — ABNORMAL LOW (ref 13.0–18.0)
MCH: 30.2 pg (ref 26.0–34.0)
MCHC: 33.2 g/dL (ref 32.0–36.0)
MCV: 90.9 fL (ref 80.0–100.0)
PLATELETS: 189 10*3/uL (ref 150–440)
RBC: 4.26 MIL/uL — AB (ref 4.40–5.90)
RDW: 13.2 % (ref 11.5–14.5)
WBC: 13.5 10*3/uL — ABNORMAL HIGH (ref 3.8–10.6)

## 2015-04-26 LAB — TROPONIN I

## 2015-04-26 NOTE — Discharge Instructions (Signed)

## 2015-04-26 NOTE — ED Provider Notes (Signed)
[facility] Emergency Department Provider Note  ____________________________________________  Time seen: On arrival  I have reviewed the triage vital signs and the nursing notes.   HISTORY  Chief Complaint Near Syncope    HPI Raymond Navarro is a 81 y.o. male who presents with dizziness. 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 He denies chest pain fevers or chills. No nausea no vomiting. He does have a history of a pacemaker and reports that he has had several episodes of dizziness essentially annually for many years. No shortness of breath or recent travel. No diaphoresis nausea or vomiting. He feels well the emergency department    Past Medical History  Diagnosis Date  . Hypertension   . Dysrhythmia     ATRIAL FIBRILATION  . Diabetes mellitus     type 2  . Cancer (HCC)     hx of skin cancer  . Parkinson disease [facility])     Patient Active Problem List   Diagnosis Date Noted  . Preop cardiovascular exam 01/09/2013  . Atrial fibrillation ([place]) 04/10/2012  . Pacemaker-Medtronic 01/12/2012    Past Surgical History  Procedure Laterality Date  . Tonsillectomy    . Cholecystectomy    . Appendectomy    . Vasectomy    . Hip surgery    . Permanent pacemaker insertion N/A 01/11/2012    Procedure: PERMANENT PACEMAKER INSERTION;  Surgeon: Evans Lance, MD;  Location: [facility] CATH LAB;  Service: Cardiovascular;  Laterality: N/A;    Current Outpatient Rx  Name  Route  Sig  Dispense  Refill  . diltiazem (CARDIZEM CD) 360 MG 24 hr capsule   Oral   Take 360 mg by mouth daily.         Marland Kitchen glimepiride (AMARYL) 4 MG tablet   Oral   Take 4 mg by mouth 2 (two) times daily.          . metFORMIN (GLUCOPHAGE) 500 MG tablet   Oral   Take 500 mg by mouth 2 (two) times daily with a meal.         . metoprolol tartrate (LOPRESSOR) 25 MG tablet   Oral   Take 25  mg by mouth 2 (two) times daily.         . Tamsulosin HCl (FLOMAX) 0.4 MG CAPS   Oral   Take 0.4 mg by mouth daily.          . traMADol (ULTRAM) 50 MG tablet   Oral   Take 50 mg by mouth every 6 (six) hours as needed.          . warfarin (COUMADIN) 4 MG tablet   Oral   Take 4 mg by mouth as directed.         . warfarin (COUMADIN) 5 MG tablet   Oral   Take 5 mg by mouth as directed.            Allergies Niacin and related  Family History  Problem Relation Age of Onset  . Family history unknown: Yes    Social History Social History  Substance Use Topics  . Smoking status: Never Smoker   . Smokeless tobacco: Never Used  . Alcohol Use: No     Comment: occasional    Review of Systems  Constitutional: Negative for fever. Eyes: Negative for visual changes. ENT: Negative for sore throat Cardiovascular: Negative for chest  pain. Respiratory: Negative for shortness of breath. Gastrointestinal: Negative for abdominal pain, vomiting and diarrhea. Genitourinary: Negative for dysuria. Musculoskeletal: Negative for back pain. Skin: Negative for rash. Neurological: Negative for headaches or focal weakness. Positive for dizziness Psychiatric: No anxiety    ____________________________________________   PHYSICAL EXAM:  VITAL SIGNS: ED Triage Vitals  Enc Vitals Group     BP 04/26/15 1106 121/72 mmHg     Pulse Rate 04/26/15 1106 77     Resp 04/26/15 1106 19     Temp 04/26/15 1106 97.7 F (36.5 C)     Temp Source 04/26/15 1106 Oral     SpO2 04/26/15 1106 97 %     Weight 04/26/15 1106 147 lb (66.679 kg)     Height 04/26/15 1106 5\' 10"  (1.778 m)     Head Cir --      Peak Flow --      Pain Score --      Pain Loc --      Pain Edu? --      Excl. in [place]? --      Constitutional: Alert and oriented. Well appearing and in no distress. Pleasant and interactive Eyes: Conjunctivae are normal.  ENT   Head: Normocephalic and atraumatic.   Mouth/Throat:  Mucous membranes are moist. Cardiovascular: Normal rate, regular rhythm. Normal and symmetric distal pulses are present in all extremities.  Respiratory: Normal respiratory effort without tachypnea nor retractions. Breath sounds are clear and equal bilaterally.  Gastrointestinal: Soft and non-tender in all quadrants. No distention. There is no CVA tenderness. Genitourinary: deferred Musculoskeletal: Nontender with normal range of motion in all extremities. No lower extremity tenderness nor edema. Neurologic:  Normal speech and language. No gross focal neurologic deficits are appreciated. Skin:  Skin is warm, dry and intact. No rash noted. Psychiatric: Mood and affect are normal. Patient exhibits appropriate insight and judgment.  ____________________________________________    LABS (pertinent positives/negatives)  Labs Reviewed  CBC - Abnormal; Notable for the following:    WBC 13.5 (*)    RBC 4.26 (*)    Hemoglobin 12.9 (*)    HCT 38.7 (*)    All other components within normal limits  COMPREHENSIVE METABOLIC PANEL - Abnormal; Notable for the following:    Sodium 130 (*)    Chloride 99 (*)    Glucose, Bld 267 (*)    BUN 24 (*)    Calcium 8.1 (*)    Total Protein 5.7 (*)    Albumin 3.4 (*)    ALT 11 (*)    All other components within normal limits  TROPONIN I    ____________________________________________   EKG  ED ECG REPORT I, Lavonia Drafts, the attending physician, personally viewed and interpreted this ECG.   Date: 04/26/2015  EKG Time: 11:09 AM  Rate: 69  Rhythm: normal sinus rhythm  Axis: Normal  Intervals:none  ST&T Change: Nonspecific   ____________________________________________    RADIOLOGY I have personally reviewed any xrays that were ordered on this patient: None  ____________________________________________   PROCEDURES  Procedure(s) performed: none  Critical Care performed:  none  ____________________________________________   INITIAL IMPRESSION / ASSESSMENT AND PLAN / ED COURSE  Pertinent labs & imaging results that were available during my care of the patient were reviewed by me and considered in my medical decision making (see chart for details).  Patient overall well-appearing with unremarkable EKG and vitals. We will give IV fluids, check labs and reevaluate with orthostatics  ----------------------------------------- 2:57 PM on 04/26/2015 -----------------------------------------  Labs returned patient does have a mild elevation of white blood cell count but he has no indication of any infection, his sodium level slightly low at 130 and his BUN is slightly elevated but he has received 1 L of normal saline in the emergency department which I believe will have helped both of these. He no longer has any dizziness and is anxious to go home, we will check orthostatics and if normal will discharge with PCP follow-up  ____________________________________________   FINAL CLINICAL IMPRESSION(S) / ED DIAGNOSES  Final diagnoses:  Near syncope     Lavonia Drafts, MD 04/26/15 1526

## 2015-04-26 NOTE — ED Notes (Signed)
PAtient arrived by EMS from [facility]. 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

## 2015-10-13 ENCOUNTER — Other Ambulatory Visit: Payer: Self-pay | Admitting: Nurse Practitioner

## 2015-10-13 DIAGNOSIS — R1319 Other dysphagia: Secondary | ICD-10-CM

## 2015-10-15 ENCOUNTER — Ambulatory Visit
Admission: RE | Admit: 2015-10-15 | Discharge: 2015-10-15 | Disposition: A | Discharge status: Home / Self Care | Payer: Medicare Other | Source: Ambulatory Visit | Attending: Nurse Practitioner | Admitting: Nurse Practitioner

## 2015-10-15 DIAGNOSIS — R1319 Other dysphagia: Secondary | ICD-10-CM | POA: Insufficient documentation

## 2015-10-27 ENCOUNTER — Encounter: Payer: Self-pay | Admitting: Anesthesiology

## 2015-10-27 ENCOUNTER — Encounter: Payer: Self-pay | Admitting: *Deleted

## 2015-11-02 NOTE — Discharge Instructions (Signed)

## 2015-11-03 ENCOUNTER — Ambulatory Visit: Admission: RE | Admit: 2015-11-03 | Payer: Medicare Other | Source: Ambulatory Visit | Admitting: Gastroenterology

## 2015-11-03 HISTORY — DX: Presence of cardiac pacemaker: Z95.0

## 2015-11-03 HISTORY — DX: Presence of external hearing-aid: Z97.4

## 2015-11-03 HISTORY — DX: Full incontinence of feces: R15.9

## 2015-11-03 HISTORY — DX: Noninfective gastroenteritis and colitis, unspecified: K52.9

## 2015-11-03 SURGERY — COLONOSCOPY
Anesthesia: Choice

## 2015-12-01 ENCOUNTER — Encounter: Payer: Self-pay | Admitting: *Deleted

## 2015-12-02 ENCOUNTER — Encounter
Admission: RE | Disposition: A | Discharge status: Home / Self Care | Payer: Self-pay | Source: Ambulatory Visit | Attending: Gastroenterology

## 2015-12-02 ENCOUNTER — Ambulatory Visit
Admission: RE | Admit: 2015-12-02 | Discharge: 2015-12-02 | Disposition: A | Discharge status: Home / Self Care | Payer: Medicare Other | Source: Ambulatory Visit | Attending: Gastroenterology | Admitting: Gastroenterology

## 2015-12-02 ENCOUNTER — Encounter: Payer: Self-pay | Admitting: *Deleted

## 2015-12-02 ENCOUNTER — Ambulatory Visit: Payer: Medicare Other | Admitting: Anesthesiology

## 2015-12-02 DIAGNOSIS — G20 Parkinson's disease: Secondary | ICD-10-CM | POA: Insufficient documentation

## 2015-12-02 DIAGNOSIS — E119 Type 2 diabetes mellitus without complications: Secondary | ICD-10-CM | POA: Diagnosis not present

## 2015-12-02 DIAGNOSIS — Z7984 Long term (current) use of oral hypoglycemic drugs: Secondary | ICD-10-CM | POA: Diagnosis not present

## 2015-12-02 DIAGNOSIS — I482 Chronic atrial fibrillation: Secondary | ICD-10-CM | POA: Diagnosis not present

## 2015-12-02 DIAGNOSIS — I10 Essential (primary) hypertension: Secondary | ICD-10-CM | POA: Insufficient documentation

## 2015-12-02 DIAGNOSIS — Z950 Presence of cardiac pacemaker: Secondary | ICD-10-CM | POA: Insufficient documentation

## 2015-12-02 DIAGNOSIS — E785 Hyperlipidemia, unspecified: Secondary | ICD-10-CM | POA: Insufficient documentation

## 2015-12-02 DIAGNOSIS — E559 Vitamin D deficiency, unspecified: Secondary | ICD-10-CM | POA: Diagnosis not present

## 2015-12-02 DIAGNOSIS — R159 Full incontinence of feces: Secondary | ICD-10-CM | POA: Insufficient documentation

## 2015-12-02 DIAGNOSIS — Z5309 Procedure and treatment not carried out because of other contraindication: Secondary | ICD-10-CM | POA: Diagnosis not present

## 2015-12-02 DIAGNOSIS — Z85828 Personal history of other malignant neoplasm of skin: Secondary | ICD-10-CM | POA: Diagnosis not present

## 2015-12-02 DIAGNOSIS — Z79899 Other long term (current) drug therapy: Secondary | ICD-10-CM | POA: Insufficient documentation

## 2015-12-02 DIAGNOSIS — Z7901 Long term (current) use of anticoagulants: Secondary | ICD-10-CM | POA: Insufficient documentation

## 2015-12-02 DIAGNOSIS — H9193 Unspecified hearing loss, bilateral: Secondary | ICD-10-CM | POA: Diagnosis not present

## 2015-12-02 DIAGNOSIS — D649 Anemia, unspecified: Secondary | ICD-10-CM | POA: Insufficient documentation

## 2015-12-02 DIAGNOSIS — K621 Rectal polyp: Secondary | ICD-10-CM | POA: Diagnosis not present

## 2015-12-02 DIAGNOSIS — Z800 Family history of malignant neoplasm of digestive organs: Secondary | ICD-10-CM | POA: Insufficient documentation

## 2015-12-02 DIAGNOSIS — Z888 Allergy status to other drugs, medicaments and biological substances status: Secondary | ICD-10-CM | POA: Diagnosis not present

## 2015-12-02 DIAGNOSIS — Z8719 Personal history of other diseases of the digestive system: Secondary | ICD-10-CM | POA: Insufficient documentation

## 2015-12-02 HISTORY — DX: Vitamin D deficiency, unspecified: E55.9

## 2015-12-02 HISTORY — DX: Hyperlipidemia, unspecified: E78.5

## 2015-12-02 HISTORY — DX: Anemia, unspecified: D64.9

## 2015-12-02 HISTORY — DX: Chronic atrial fibrillation, unspecified: I48.20

## 2015-12-02 HISTORY — DX: Personal history of other infectious and parasitic diseases: Z86.19

## 2015-12-02 HISTORY — PX: COLONOSCOPY WITH PROPOFOL: SHX5780

## 2015-12-02 LAB — PROTIME-INR
INR: 1.35
PROTHROMBIN TIME: 16.8 s — AB (ref 11.4–15.0)

## 2015-12-02 LAB — GLUCOSE, CAPILLARY: Glucose-Capillary: 180 mg/dL — ABNORMAL HIGH (ref 65–99)

## 2015-12-02 SURGERY — COLONOSCOPY WITH PROPOFOL
Anesthesia: General

## 2015-12-02 MED ORDER — SODIUM CHLORIDE 0.9 % IV SOLN: INTRAVENOUS | Status: DC

## 2015-12-02 MED ORDER — PROPOFOL 500 MG/50ML IV EMUL
INTRAVENOUS | Status: DC | PRN
  Administered 2015-12-02: 140 ug/kg/min via INTRAVENOUS

## 2015-12-02 MED ORDER — LIDOCAINE 2% (20 MG/ML) 5 ML SYRINGE
INTRAMUSCULAR | Status: DC | PRN
  Administered 2015-12-02: 40 mg via INTRAVENOUS

## 2015-12-02 MED ORDER — MIDAZOLAM HCL 5 MG/5ML IJ SOLN
INTRAMUSCULAR | Status: DC | PRN
  Administered 2015-12-02: 1 mg via INTRAVENOUS

## 2015-12-02 MED ORDER — FENTANYL CITRATE (PF) 100 MCG/2ML IJ SOLN
INTRAMUSCULAR | Status: DC | PRN
  Administered 2015-12-02: 50 ug via INTRAVENOUS

## 2015-12-02 MED ORDER — PROPOFOL 10 MG/ML IV BOLUS
INTRAVENOUS | Status: DC | PRN
  Administered 2015-12-02: 100 mg via INTRAVENOUS

## 2015-12-02 MED ORDER — SODIUM CHLORIDE 0.9 % IV SOLN
INTRAVENOUS | Status: DC
  Administered 2015-12-02: 08:00:00 via INTRAVENOUS

## 2015-12-02 NOTE — Transfer of Care (Signed)
Immediate Anesthesia Transfer of Care Note  Patient: Raymond Navarro  Procedure(s) Performed: Procedure(s): COLONOSCOPY WITH PROPOFOL (N/A)  Patient Location: PACU and Endoscopy Unit  Anesthesia Type:General  Level of Consciousness: sedated  Airway & Oxygen Therapy: Patient Spontanous Breathing and Patient connected to nasal cannula oxygen  Post-op Assessment: Report given to RN and Post -op Vital signs reviewed and stable  Post vital signs: Reviewed and stable  Last Vitals:  Filed Vitals:   12/02/15 0747  BP: 124/71  Pulse: 97  Temp: 35.3 C  Resp: 18    Last Pain: There were no vitals filed for this visit.       Complications: No apparent anesthesia complications

## 2015-12-02 NOTE — Anesthesia Postprocedure Evaluation (Signed)
Anesthesia Post Note  Patient: Raymond Navarro  Procedure(s) Performed: Procedure(s) (LRB): COLONOSCOPY WITH PROPOFOL (N/A)  Patient location during evaluation: Endoscopy Anesthesia Type: General Level of consciousness: awake and alert Pain management: pain level controlled Vital Signs Assessment: post-procedure vital signs reviewed and stable Respiratory status: spontaneous breathing, nonlabored ventilation, respiratory function stable and patient connected to nasal cannula oxygen Cardiovascular status: blood pressure returned to baseline and stable Postop Assessment: no signs of nausea or vomiting Anesthetic complications: no    Last Vitals:  Filed Vitals:   12/02/15 0920 12/02/15 0930  BP: 100/62 105/59  Pulse: 71 70  Temp:    Resp: 13 17    Last Pain: There were no vitals filed for this visit.               Levonne Carreras S

## 2015-12-02 NOTE — H&P (Signed)
Outpatient short stay form Pre-procedure 12/02/2015 8:39 AM Raymond Sails MD  Primary Physician: Dr. Emily Filbert  Reason for visit:  Colonoscopy  History of present illness:  Patient is a 82 year old male presenting today for colonoscopy. He has a personal history of some multiple episodes some fecal incontinence of formed stool above. States his stools generally are formed. He has a family history of colon cancer in 2 primary relatives. He had a colonoscopy in 2007 showing some evidence of colitis undetermined etiology. He apparently has not been an issue and he does not have a diagnosis of ulcerative colitis. He tolerated his prep well. He does take Coumadin daily. His INR was checked today this being 1.35. This is slightly above 1.3 level however I have discussed with the patient and I feel this is adequate at this point for procedure. He takes no other blood thinning agents or aspirin products.    Current facility-administered medications:  .  0.9 %  sodium chloride infusion, , Intravenous, Continuous, Raymond Sails, MD .  0.9 %  sodium chloride infusion, , Intravenous, Continuous, Raymond Sails, MD  Prescriptions prior to admission  Medication Sig Dispense Refill Last Dose  . carbidopa-levodopa (SINEMET IR) 25-100 MG tablet Take 1 tablet by mouth 3 (three) times daily.   12/02/2015 at [place]  . diltiazem (CARDIZEM CD) 360 MG 24 hr capsule Take 360 mg by mouth daily.   12/02/2015 at [place]  . warfarin (COUMADIN) 5 MG tablet Take 5 mg by mouth as directed.    Past Week at Unknown time  . acetaminophen (TYLENOL) 325 MG tablet Take 650 mg by mouth every 6 (six) hours as needed.     . brimonidine (ALPHAGAN P) 0.1 % SOLN 2 drops 2 (two) times daily.     Marland Kitchen glimepiride (AMARYL) 4 MG tablet Take 4 mg by mouth 2 (two) times daily.    Taking  . metFORMIN (GLUCOPHAGE) 500 MG tablet Take 500 mg by mouth 2 (two) times daily with a meal.   Taking  . pravastatin (PRAVACHOL) 20 MG tablet Take 20 mg  by mouth daily.     . Tamsulosin HCl (FLOMAX) 0.4 MG CAPS Take 0.4 mg by mouth daily.    Taking     Allergies  Allergen Reactions  . Niacin And Related     Rash and itching     Past Medical History  Diagnosis Date  . Hypertension   . Dysrhythmia     ATRIAL FIBRILATION  . Diabetes mellitus     type 2  . Cancer (HCC)     hx of skin cancer  . Parkinson disease ([place])   . Colitis     Hx of  . Incontinence of feces   . Presence of permanent cardiac pacemaker 01/11/12    Medtronic [place] Single chamber, Serial # [id number]  . Wears hearing aid     bilateral  . Anemia   . Atrial fibrillation, chronic ([place])   . History of chicken pox   . Hyperlipidemia   . Vitamin D deficiency     Review of systems:      Physical Exam    Heart and lungs: Irregular, lungs are bilaterally clear.    HEENT: Normocephalic atraumatic eyes are anicteric    Other:     Pertinant exam for procedure: Soft nontender nondistended bowel sounds positive normoactive.    Planned proceedures: Colonoscopy and indicated procedures.    Raymond Sails, MD Gastroenterology 12/02/2015  8:39 AM

## 2015-12-02 NOTE — Anesthesia Preprocedure Evaluation (Signed)
Anesthesia Evaluation  Patient identified by MRN, date of birth, ID band Patient awake    Reviewed: Allergy & Precautions, NPO status , Patient's Chart, lab work & pertinent test results, reviewed documented beta blocker date and time   Airway Mallampati: II  TM Distance: >3 FB     Dental  (+) Chipped   Pulmonary           Cardiovascular hypertension, Pt. on medications + dysrhythmias Atrial Fibrillation + pacemaker      Neuro/Psych    GI/Hepatic   Endo/Other  diabetes, Type 2  Renal/GU      Musculoskeletal   Abdominal   Peds  Hematology  (+) anemia ,   Anesthesia Other Findings   Reproductive/Obstetrics                             Anesthesia Physical Anesthesia Plan  ASA: III  Anesthesia Plan: General   Post-op Pain Management:    Induction: Intravenous  Airway Management Planned: Nasal Cannula  Additional Equipment:   Intra-op Plan:   Post-operative Plan:   Informed Consent: I have reviewed the patients History and Physical, chart, labs and discussed the procedure including the risks, benefits and alternatives for the proposed anesthesia with the patient or authorized representative who has indicated his/her understanding and acceptance.     Plan Discussed with: CRNA  Anesthesia Plan Comments:         Anesthesia Quick Evaluation

## 2015-12-02 NOTE — Op Note (Signed)
[facility] Gastroenterology Patient Name: Raymond Navarro Procedure Date: 12/02/2015 8:43 AM MRN: [id number] Account #: [id number] Date of Birth: Oct 11, 1928 Admit Type: Outpatient Age: 82 Room: [facility] ENDO ROOM 1 Gender: Male Note Status: Finalized Procedure:            Colonoscopy Providers:            Lollie Sails, MD Referring MD:         Rusty Aus, MD (Referring MD) Complications:        No immediate complications. Procedure:            Pre-Anesthesia Assessment:                       - ASA Grade Assessment: III - A patient with severe                        systemic disease.                       After obtaining informed consent, the colonoscope was                        passed under direct vision. Throughout the procedure,                        the patient's blood pressure, pulse, and oxygen                        saturations were monitored continuously. The                        Colonoscope was introduced through the anus with the                        intention of advancing to the cecum. The scope was                        advanced to the rectum before the procedure was                        aborted. Medications were given. The colonoscopy was                        performed without difficulty. The patient tolerated the                        procedure well. Findings:      A 30 mm polyp was found in the rectum. The polyp was semi-sessile. This       polyp was very friable to touch. Impression:           - One 30 mm polyp in the rectum.                       - No specimens collected. Recommendation:       - Put patient on a clear liquid diet starting today.                       - Patient will need to repeat a partial prep and return  tomorrrow., His Inr was still elevated today, adequate                        to inspect and remove small lesions, however this                        lesion is much larger, friable and  much higher risk for                        post polypectomy bleeding. Will need INR normalized                        prior. Procedure Code(s):    --- Professional ---                       [phone], 45, Colonoscopy, flexible; diagnostic, including                        collection of specimen(s) by brushing or washing, when                        performed (separate procedure) Diagnosis Code(s):    --- Professional ---                       K62.1, Rectal polyp CPT copyright 2016 American Medical Association. All rights reserved. The codes documented in this report are preliminary and upon coder review may  be revised to meet current compliance requirements. Lollie Sails, MD 12/02/2015 9:03:23 AM This report has been signed electronically. Number of Addenda: 0 Note Initiated On: 12/02/2015 8:43 AM Total Procedure Duration: 0 hours 1 minute 23 seconds       [facility]

## 2015-12-03 ENCOUNTER — Ambulatory Visit
Admission: RE | Admit: 2015-12-03 | Discharge: 2015-12-03 | Disposition: A | Discharge status: Home / Self Care | Payer: Medicare Other | Source: Ambulatory Visit | Attending: Gastroenterology | Admitting: Gastroenterology

## 2015-12-03 ENCOUNTER — Encounter
Admission: RE | Disposition: A | Discharge status: Home / Self Care | Payer: Self-pay | Source: Ambulatory Visit | Attending: Gastroenterology

## 2015-12-03 ENCOUNTER — Ambulatory Visit: Payer: Medicare Other | Admitting: Anesthesiology

## 2015-12-03 ENCOUNTER — Encounter: Payer: Self-pay | Admitting: Gastroenterology

## 2015-12-03 DIAGNOSIS — Z539 Procedure and treatment not carried out, unspecified reason: Secondary | ICD-10-CM | POA: Insufficient documentation

## 2015-12-03 DIAGNOSIS — Z8719 Personal history of other diseases of the digestive system: Secondary | ICD-10-CM | POA: Insufficient documentation

## 2015-12-03 LAB — CBC
HCT: 39.2 % — ABNORMAL LOW (ref 40.0–52.0)
HEMOGLOBIN: 13.7 g/dL (ref 13.0–18.0)
MCH: 31.3 pg (ref 26.0–34.0)
MCHC: 35 g/dL (ref 32.0–36.0)
MCV: 89.3 fL (ref 80.0–100.0)
Platelets: 182 10*3/uL (ref 150–440)
RBC: 4.39 MIL/uL — AB (ref 4.40–5.90)
RDW: 13.4 % (ref 11.5–14.5)
WBC: 8.5 10*3/uL (ref 3.8–10.6)

## 2015-12-03 LAB — PROTIME-INR
INR: 1.28
INR: 1.3
PROTHROMBIN TIME: 16.1 s — AB (ref 11.4–15.0)
PROTHROMBIN TIME: 16.3 s — AB (ref 11.4–15.0)

## 2015-12-03 SURGERY — COLONOSCOPY WITH PROPOFOL
Anesthesia: General

## 2015-12-03 MED ORDER — SODIUM CHLORIDE 0.9 % IV SOLN: INTRAVENOUS | Status: DC

## 2015-12-03 NOTE — Pre-Procedure Instructions (Signed)
PT.IS HERE FOR BLOODWORK  AND COLONOSCOPY.PRO-TIME IS STILL TOO HIGH the patient.IS RESCHEDULED FOR Monday.

## 2015-12-03 NOTE — OR Nursing (Signed)
Pt procedure delayed until later this afternoon per Dr Gustavo Lah.

## 2015-12-03 NOTE — Anesthesia Preprocedure Evaluation (Signed)
Anesthesia Evaluation  Patient identified by MRN, date of birth, ID band Patient awake    Reviewed: Allergy & Precautions, NPO status , Patient's Chart, lab work & pertinent test results, reviewed documented beta blocker date and time   Airway Mallampati: II  TM Distance: >3 FB     Dental  (+) Chipped   Pulmonary           Cardiovascular hypertension, Pt. on medications + dysrhythmias Atrial Fibrillation + pacemaker      Neuro/Psych    GI/Hepatic   Endo/Other  diabetes, Type 2  Renal/GU      Musculoskeletal   Abdominal   Peds  Hematology  (+) anemia ,   Anesthesia Other Findings Parkinson.  Reproductive/Obstetrics                             Anesthesia Physical Anesthesia Plan  ASA: III  Anesthesia Plan: General   Post-op Pain Management:    Induction: Intravenous  Airway Management Planned: Nasal Cannula  Additional Equipment:   Intra-op Plan:   Post-operative Plan:   Informed Consent: I have reviewed the patients History and Physical, chart, labs and discussed the procedure including the risks, benefits and alternatives for the proposed anesthesia with the patient or authorized representative who has indicated his/her understanding and acceptance.     Plan Discussed with: CRNA  Anesthesia Plan Comments:         Anesthesia Quick Evaluation

## 2015-12-06 ENCOUNTER — Ambulatory Visit: Payer: Medicare Other | Admitting: Anesthesiology

## 2015-12-06 ENCOUNTER — Encounter
Admission: RE | Disposition: A | Discharge status: Home / Self Care | Payer: Self-pay | Source: Ambulatory Visit | Attending: Gastroenterology

## 2015-12-06 ENCOUNTER — Ambulatory Visit
Admission: RE | Admit: 2015-12-06 | Discharge: 2015-12-06 | Disposition: A | Discharge status: Home / Self Care | Payer: Medicare Other | Source: Ambulatory Visit | Attending: Gastroenterology | Admitting: Gastroenterology

## 2015-12-06 ENCOUNTER — Encounter: Payer: Self-pay | Admitting: *Deleted

## 2015-12-06 DIAGNOSIS — G20 Parkinson's disease: Secondary | ICD-10-CM | POA: Diagnosis not present

## 2015-12-06 DIAGNOSIS — D128 Benign neoplasm of rectum: Secondary | ICD-10-CM | POA: Diagnosis not present

## 2015-12-06 DIAGNOSIS — Z7984 Long term (current) use of oral hypoglycemic drugs: Secondary | ICD-10-CM | POA: Insufficient documentation

## 2015-12-06 DIAGNOSIS — Z800 Family history of malignant neoplasm of digestive organs: Secondary | ICD-10-CM | POA: Insufficient documentation

## 2015-12-06 DIAGNOSIS — Z950 Presence of cardiac pacemaker: Secondary | ICD-10-CM | POA: Diagnosis not present

## 2015-12-06 DIAGNOSIS — E119 Type 2 diabetes mellitus without complications: Secondary | ICD-10-CM | POA: Insufficient documentation

## 2015-12-06 DIAGNOSIS — E785 Hyperlipidemia, unspecified: Secondary | ICD-10-CM | POA: Insufficient documentation

## 2015-12-06 DIAGNOSIS — E559 Vitamin D deficiency, unspecified: Secondary | ICD-10-CM | POA: Diagnosis not present

## 2015-12-06 DIAGNOSIS — I482 Chronic atrial fibrillation: Secondary | ICD-10-CM | POA: Insufficient documentation

## 2015-12-06 DIAGNOSIS — R159 Full incontinence of feces: Secondary | ICD-10-CM | POA: Insufficient documentation

## 2015-12-06 DIAGNOSIS — Z888 Allergy status to other drugs, medicaments and biological substances status: Secondary | ICD-10-CM | POA: Insufficient documentation

## 2015-12-06 DIAGNOSIS — Z79899 Other long term (current) drug therapy: Secondary | ICD-10-CM | POA: Insufficient documentation

## 2015-12-06 DIAGNOSIS — I10 Essential (primary) hypertension: Secondary | ICD-10-CM | POA: Diagnosis not present

## 2015-12-06 DIAGNOSIS — K5730 Diverticulosis of large intestine without perforation or abscess without bleeding: Secondary | ICD-10-CM | POA: Diagnosis not present

## 2015-12-06 DIAGNOSIS — Z85828 Personal history of other malignant neoplasm of skin: Secondary | ICD-10-CM | POA: Diagnosis not present

## 2015-12-06 DIAGNOSIS — Z7901 Long term (current) use of anticoagulants: Secondary | ICD-10-CM | POA: Diagnosis not present

## 2015-12-06 HISTORY — PX: COLONOSCOPY WITH PROPOFOL: SHX5780

## 2015-12-06 LAB — PROTIME-INR
INR: 1.16
PROTHROMBIN TIME: 15 s (ref 11.4–15.0)

## 2015-12-06 LAB — GLUCOSE, CAPILLARY: GLUCOSE-CAPILLARY: 227 mg/dL — AB (ref 65–99)

## 2015-12-06 SURGERY — COLONOSCOPY WITH PROPOFOL
Anesthesia: General

## 2015-12-06 MED ORDER — SODIUM CHLORIDE 0.9 % IV SOLN: INTRAVENOUS | Status: DC

## 2015-12-06 MED ORDER — SODIUM CHLORIDE 0.9 % IV SOLN
INTRAVENOUS | Status: DC
  Administered 2015-12-06: 1000 mL via INTRAVENOUS

## 2015-12-06 MED ORDER — PROPOFOL 500 MG/50ML IV EMUL
INTRAVENOUS | Status: DC | PRN
  Administered 2015-12-06: 120 ug/kg/min via INTRAVENOUS

## 2015-12-06 MED ORDER — SPOT INK MARKER SYRINGE KIT
PACK | SUBMUCOSAL | Status: DC | PRN
  Administered 2015-12-06: 2 mL via SUBMUCOSAL

## 2015-12-06 MED ORDER — PROPOFOL 10 MG/ML IV BOLUS
INTRAVENOUS | Status: DC | PRN
  Administered 2015-12-06: 70 mg via INTRAVENOUS

## 2015-12-06 NOTE — Anesthesia Preprocedure Evaluation (Signed)
Anesthesia Evaluation  Patient identified by MRN, date of birth, ID band Patient awake    Reviewed: Allergy & Precautions, H&P , NPO status , Patient's Chart, lab work & pertinent test results, reviewed documented beta blocker date and time   History of Anesthesia Complications Negative for: history of anesthetic complications  Airway Mallampati: I  TM Distance: >3 FB Neck ROM: full    Dental no notable dental hx. (+) Poor Dentition   Pulmonary neg pulmonary ROS,    Pulmonary exam normal breath sounds clear to auscultation       Cardiovascular Exercise Tolerance: Good hypertension, (-) angina(-) CAD, (-) Past MI, (-) Cardiac Stents and (-) CABG Normal cardiovascular exam+ dysrhythmias Atrial Fibrillation + pacemaker (-) Valvular Problems/Murmurs Rhythm:regular Rate:Normal     Neuro/Psych neg Seizures Parkinson's disease negative psych ROS   GI/Hepatic negative GI ROS, Neg liver ROS,   Endo/Other  diabetes  Renal/GU negative Renal ROS  negative genitourinary   Musculoskeletal   Abdominal   Peds  Hematology negative hematology ROS (+)   Anesthesia Other Findings Past Medical History:   Hypertension                                                 Dysrhythmia                                                    Comment:ATRIAL FIBRILATION   Diabetes mellitus                                              Comment:type 2   Cancer (HCC)                                                   Comment:hx of skin cancer   Parkinson disease ([place])                                      Colitis                                                        Comment:Hx of   Incontinence of feces                                        Presence of permanent cardiac pacemaker         01/11/12         Comment:Medtronic [place] Single chamber, Serial # [id number]               [id number]   Wears hearing aid  Comment:bilateral   Anemia                                                       Atrial fibrillation, chronic (HCC)                           History of chicken pox                                       Hyperlipidemia                                               Vitamin D deficiency                                         Reproductive/Obstetrics negative OB ROS                             Anesthesia Physical Anesthesia Plan  ASA: III  Anesthesia Plan: General   Post-op Pain Management:    Induction:   Airway Management Planned:   Additional Equipment:   Intra-op Plan:   Post-operative Plan:   Informed Consent: I have reviewed the patients History and Physical, chart, labs and discussed the procedure including the risks, benefits and alternatives for the proposed anesthesia with the patient or authorized representative who has indicated his/her understanding and acceptance.   Dental Advisory Given  Plan Discussed with: Anesthesiologist, CRNA and Surgeon  Anesthesia Plan Comments:         Anesthesia Quick Evaluation

## 2015-12-06 NOTE — Transfer of Care (Signed)
Immediate Anesthesia Transfer of Care Note  Patient: Raymond Navarro  Procedure(s) Performed: Procedure(s): COLONOSCOPY WITH PROPOFOL (N/A)  Patient Location: PACU and Endoscopy Unit  Anesthesia Type:General  Level of Consciousness: sedated and responds to stimulation  Airway & Oxygen Therapy: Patient Spontanous Breathing and Patient connected to nasal cannula oxygen  Post-op Assessment: Report given to RN and Post -op Vital signs reviewed and stable  Post vital signs: Reviewed and stable  Last Vitals:  Filed Vitals:   12/06/15 1329 12/06/15 1633  BP:  111/61  Pulse: 102 79  Temp: 36.3 C 35.8 C  Resp: 18 14    Last Pain: There were no vitals filed for this visit.       Complications: No apparent anesthesia complications

## 2015-12-06 NOTE — Consult Note (Addendum)
Outpatient short stay form Pre-procedure 12/06/2015 3:09 PM Raymond Sails MD  Primary Physician: Dr. Emily Filbert  Reason for visit:  Colonoscopy  History of present illness:  Patient is a 82 year old male presenting today for colonoscopy. He has recent history of difficulty with fecal incontinence as being intermittent in nature. There is also family history of colon cancer in primary relatives as well as a secondary relative. Patient's last colonoscopy was in 2007. He tolerated his prep well. He was initially seen 4 days ago at which point his INR was somewhat borderline for extensive procedure due to daily taking Coumadin. He had stopped the medication however is INR was still little high. On introduction of the scope into the rectum there was a large polyp noted. As such she was reprepped and rescheduled to allow her pro time to drift down a little further.    Current facility-administered medications:  .  0.9 %  sodium chloride infusion, , Intravenous, Continuous, Raymond Sails, MD, Last Rate: 20 mL/hr at 12/06/15 1508, 1,000 mL at 12/06/15 1508 .  0.9 %  sodium chloride infusion, , Intravenous, Continuous, Raymond Sails, MD  Prescriptions prior to admission  Medication Sig Dispense Refill Last Dose  . brimonidine (ALPHAGAN P) 0.1 % SOLN 2 drops 2 (two) times daily.   Past Week at Unknown time  . carbidopa-levodopa (SINEMET IR) 25-100 MG tablet Take 1 tablet by mouth 3 (three) times daily.   Past Week at Unknown time  . diltiazem (CARDIZEM CD) 360 MG 24 hr capsule Take 360 mg by mouth daily.   Past Week at Unknown time  . glimepiride (AMARYL) 4 MG tablet Take 4 mg by mouth 2 (two) times daily.    Past Week at Unknown time  . metFORMIN (GLUCOPHAGE) 500 MG tablet Take 500 mg by mouth 2 (two) times daily with a meal.   Past Week at Unknown time  . pravastatin (PRAVACHOL) 20 MG tablet Take 20 mg by mouth daily.   Past Week at Unknown time  . Tamsulosin HCl (FLOMAX) 0.4 MG CAPS Take  0.4 mg by mouth daily.    Past Week at Unknown time  . warfarin (COUMADIN) 5 MG tablet Take 5 mg by mouth as directed.    Past Week at Unknown time  . acetaminophen (TYLENOL) 325 MG tablet Take 650 mg by mouth every 6 (six) hours as needed.        Allergies  Allergen Reactions  . Niacin And Related     Rash and itching     Past Medical History  Diagnosis Date  . Hypertension   . Dysrhythmia     ATRIAL FIBRILATION  . Diabetes mellitus     type 2  . Cancer (HCC)     hx of skin cancer  . Parkinson disease ([place])   . Colitis     Hx of  . Incontinence of feces   . Presence of permanent cardiac pacemaker 01/11/12    Medtronic [place] Single chamber, Serial # [id number]  . Wears hearing aid     bilateral  . Anemia   . Atrial fibrillation, chronic ([place])   . History of chicken pox   . Hyperlipidemia   . Vitamin D deficiency     Review of systems:      Physical Exam    Heart and lungs: Regular rate and rhythm without rub or gallop, lungs are bilaterally clear.    HEENT: Normocephalic atraumatic eyes are anicteric    Other:  Pertinant exam for procedure: Soft nontender nondistended bowel sounds positive normoactive.    Planned proceedures: Colonoscopy and indicated procedures. I have discussed the risks benefits and complications of procedures to include not limited to bleeding, infection, perforation and the risk of sedation and the patient wishes to proceed.    Raymond Sails, MD Gastroenterology 12/06/2015  3:09 PM

## 2015-12-06 NOTE — Op Note (Signed)
[facility] Gastroenterology Patient Name: Raymond Navarro Procedure Date: 12/06/2015 3:17 PM MRN: [id number] Account #: [id number] Date of Birth: Aug 31, 1928 Admit Type: Outpatient Age: 82 Room: [facility] ENDO ROOM 1 Gender: Male Note Status: Finalized Procedure:            Colonoscopy Indications:          Family history of colon cancer in a first-degree                        relative, Family history of colon cancer in a distant                        relative Providers:            Lollie Sails, MD Referring MD:         Rusty Aus, MD (Referring MD) Medicines:            Monitored Anesthesia Care Complications:        No immediate complications. Procedure:            Pre-Anesthesia Assessment:                       - ASA Grade Assessment: III - A patient with severe                        systemic disease.                       After obtaining informed consent, the colonoscope was                        passed under direct vision. Throughout the procedure,                        the patient's blood pressure, pulse, and oxygen                        saturations were monitored continuously. The                        Colonoscope was introduced through the anus and                        advanced to the the cecum, identified by appendiceal                        orifice and ileocecal valve. The colonoscopy was                        performed with moderate difficulty. Successful                        completion of the procedure was aided by using manual                        pressure. The patient tolerated the procedure well. The                        quality of the bowel preparation was good. Findings:      Multiple small and large-mouthed diverticula were found in the sigmoid  colon, descending colon, splenic flexure, transverse colon, hepatic       flexure and ascending colon.      A 35 mm polyp was found in the rectum. The polyp was multi-lobulated  and       semi-pedunculated with a fairly broad base. The polyp was removed with a       lift and cut technique using a hot snare. The polyp was removed with a       piecemeal technique using a hot snare. Resection and retrieval were       complete. Area was tattooed with an injection of 2 mL of [place] ink an       area on both sides of the lesion. There was good hemostasis at the end       of the procedure with appearance of adequate margin and cautery. Impression:           - Diverticulosis in the sigmoid colon, in the                        descending colon, at the splenic flexure, in the                        transverse colon, at the hepatic flexure and in the                        ascending colon.                       - One 35 mm polyp in the rectum, removed using lift and                        cut and a hot snare and removed piecemeal using a hot                        snare. Resected and retrieved. Tattooed. Recommendation:       - Discharge patient to home.                       - Await pathology results.                       - no lifting for 10 days.                       - Clear liquid diet today.                       - Full liquid diet for 3 days, then advance as                        tolerated to low residue diet for 1 week. Procedure Code(s):    --- Professional ---                       [phone], Colonoscopy, flexible; with removal of tumor(s),                        polyp(s), or other lesion(s) by snare technique                       [id number], Colonoscopy, flexible; with  directed submucosal                        injection(s), any substance Diagnosis Code(s):    --- Professional ---                       K62.1, Rectal polyp                       Z80.0, Family history of malignant neoplasm of                        digestive organs                       K57.30, Diverticulosis of large intestine without                        perforation or abscess without bleeding CPT  copyright 2016 American Medical Association. All rights reserved. The codes documented in this report are preliminary and upon coder review may  be revised to meet current compliance requirements. Lollie Sails, MD 12/06/2015 4:41:31 PM This report has been signed electronically. Number of Addenda: 0 Note Initiated On: 12/06/2015 3:17 PM Scope Withdrawal Time: 0 hours 38 minutes 10 seconds  Total Procedure Duration: 1 hour 1 minute 51 seconds       [facility]

## 2015-12-07 NOTE — Anesthesia Postprocedure Evaluation (Signed)
Anesthesia Post Note  Patient: Raymond Navarro  Procedure(s) Performed: Procedure(s) (LRB): COLONOSCOPY WITH PROPOFOL (N/A)  Patient location during evaluation: Endoscopy Anesthesia Type: General Level of consciousness: awake and alert Pain management: pain level controlled Vital Signs Assessment: post-procedure vital signs reviewed and stable Respiratory status: spontaneous breathing, nonlabored ventilation, respiratory function stable and patient connected to nasal cannula oxygen Cardiovascular status: blood pressure returned to baseline and stable Postop Assessment: no signs of nausea or vomiting Anesthetic complications: no    Last Vitals:  Filed Vitals:   12/06/15 1704 12/06/15 1714  BP: 119/56 167/83  Pulse: 78 78  Temp:    Resp: 14 23    Last Pain:  Filed Vitals:   12/06/15 1715  PainSc: Asleep                 Aveon Colquhoun S

## 2015-12-08 ENCOUNTER — Encounter: Payer: Self-pay | Admitting: Gastroenterology

## 2015-12-09 LAB — SURGICAL PATHOLOGY

## 2016-05-27 ENCOUNTER — Emergency Department (HOSPITAL_COMMUNITY): Payer: Medicare Other

## 2016-05-27 ENCOUNTER — Encounter (HOSPITAL_COMMUNITY): Payer: Self-pay | Admitting: Emergency Medicine

## 2016-05-27 ENCOUNTER — Inpatient Hospital Stay (HOSPITAL_COMMUNITY)
Admission: EM | Admit: 2016-05-27 | Discharge: 2016-06-01 | DRG: 965 | Disposition: A | Discharge status: Skilled Nursing Facility | Payer: Medicare Other | Attending: Internal Medicine | Admitting: Internal Medicine

## 2016-05-27 DIAGNOSIS — I10 Essential (primary) hypertension: Secondary | ICD-10-CM | POA: Diagnosis present

## 2016-05-27 DIAGNOSIS — E785 Hyperlipidemia, unspecified: Secondary | ICD-10-CM | POA: Diagnosis present

## 2016-05-27 DIAGNOSIS — W19XXXA Unspecified fall, initial encounter: Secondary | ICD-10-CM | POA: Diagnosis present

## 2016-05-27 DIAGNOSIS — I482 Chronic atrial fibrillation, unspecified: Secondary | ICD-10-CM

## 2016-05-27 DIAGNOSIS — S01511A Laceration without foreign body of lip, initial encounter: Secondary | ICD-10-CM | POA: Diagnosis present

## 2016-05-27 DIAGNOSIS — Z79899 Other long term (current) drug therapy: Secondary | ICD-10-CM

## 2016-05-27 DIAGNOSIS — S01112A Laceration without foreign body of left eyelid and periocular area, initial encounter: Secondary | ICD-10-CM | POA: Diagnosis present

## 2016-05-27 DIAGNOSIS — S06339A Contusion and laceration of cerebrum, unspecified, with loss of consciousness of unspecified duration, initial encounter: Secondary | ICD-10-CM

## 2016-05-27 DIAGNOSIS — Z7901 Long term (current) use of anticoagulants: Secondary | ICD-10-CM

## 2016-05-27 DIAGNOSIS — M25552 Pain in left hip: Secondary | ICD-10-CM

## 2016-05-27 DIAGNOSIS — R0602 Shortness of breath: Secondary | ICD-10-CM

## 2016-05-27 DIAGNOSIS — S0232XA Fracture of orbital floor, left side, initial encounter for closed fracture: Secondary | ICD-10-CM | POA: Diagnosis present

## 2016-05-27 DIAGNOSIS — D72829 Elevated white blood cell count, unspecified: Secondary | ICD-10-CM | POA: Diagnosis present

## 2016-05-27 DIAGNOSIS — S0181XA Laceration without foreign body of other part of head, initial encounter: Secondary | ICD-10-CM | POA: Diagnosis present

## 2016-05-27 DIAGNOSIS — G20 Parkinson's disease: Secondary | ICD-10-CM | POA: Diagnosis present

## 2016-05-27 DIAGNOSIS — S0633AA Contusion and laceration of cerebrum, unspecified, with loss of consciousness status unknown, initial encounter: Secondary | ICD-10-CM

## 2016-05-27 DIAGNOSIS — Z7984 Long term (current) use of oral hypoglycemic drugs: Secondary | ICD-10-CM

## 2016-05-27 DIAGNOSIS — I4891 Unspecified atrial fibrillation: Secondary | ICD-10-CM | POA: Diagnosis present

## 2016-05-27 DIAGNOSIS — Z85828 Personal history of other malignant neoplasm of skin: Secondary | ICD-10-CM

## 2016-05-27 DIAGNOSIS — S32592A Other specified fracture of left pubis, initial encounter for closed fracture: Secondary | ICD-10-CM | POA: Diagnosis present

## 2016-05-27 DIAGNOSIS — R4182 Altered mental status, unspecified: Secondary | ICD-10-CM | POA: Diagnosis not present

## 2016-05-27 DIAGNOSIS — G20A1 Parkinson's disease without dyskinesia, without mention of fluctuations: Secondary | ICD-10-CM | POA: Diagnosis present

## 2016-05-27 DIAGNOSIS — S066X1A Traumatic subarachnoid hemorrhage with loss of consciousness of 30 minutes or less, initial encounter: Secondary | ICD-10-CM | POA: Diagnosis not present

## 2016-05-27 DIAGNOSIS — E119 Type 2 diabetes mellitus without complications: Secondary | ICD-10-CM

## 2016-05-27 DIAGNOSIS — Y92009 Unspecified place in unspecified non-institutional (private) residence as the place of occurrence of the external cause: Secondary | ICD-10-CM

## 2016-05-27 DIAGNOSIS — Z888 Allergy status to other drugs, medicaments and biological substances status: Secondary | ICD-10-CM

## 2016-05-27 DIAGNOSIS — E876 Hypokalemia: Secondary | ICD-10-CM | POA: Diagnosis present

## 2016-05-27 DIAGNOSIS — S32110A Nondisplaced Zone I fracture of sacrum, initial encounter for closed fracture: Secondary | ICD-10-CM | POA: Diagnosis present

## 2016-05-27 DIAGNOSIS — Z974 Presence of external hearing-aid: Secondary | ICD-10-CM

## 2016-05-27 DIAGNOSIS — R296 Repeated falls: Secondary | ICD-10-CM | POA: Diagnosis present

## 2016-05-27 DIAGNOSIS — Z66 Do not resuscitate: Secondary | ICD-10-CM | POA: Diagnosis present

## 2016-05-27 DIAGNOSIS — F329 Major depressive disorder, single episode, unspecified: Secondary | ICD-10-CM | POA: Diagnosis present

## 2016-05-27 DIAGNOSIS — Z950 Presence of cardiac pacemaker: Secondary | ICD-10-CM | POA: Diagnosis present

## 2016-05-27 LAB — COMPREHENSIVE METABOLIC PANEL
ALBUMIN: 4 g/dL (ref 3.5–5.0)
ALT: 7 U/L — ABNORMAL LOW (ref 17–63)
ANION GAP: 10 (ref 5–15)
AST: 29 U/L (ref 15–41)
Alkaline Phosphatase: 87 U/L (ref 38–126)
BUN: 16 mg/dL (ref 6–20)
CHLORIDE: 95 mmol/L — AB (ref 101–111)
CO2: 27 mmol/L (ref 22–32)
Calcium: 8.7 mg/dL — ABNORMAL LOW (ref 8.9–10.3)
Creatinine, Ser: 1.02 mg/dL (ref 0.61–1.24)
GFR calc Af Amer: >60 mL/min (ref >60)
GFR calc non Af Amer: >60 mL/min (ref >60)
GLUCOSE: 212 mg/dL — AB (ref 65–99)
POTASSIUM: 3 mmol/L — AB (ref 3.5–5.1)
SODIUM: 132 mmol/L — AB (ref 135–145)
Total Bilirubin: 1.2 mg/dL (ref 0.3–1.2)
Total Protein: 6.6 g/dL (ref 6.5–8.1)

## 2016-05-27 LAB — DIFFERENTIAL
BASOS ABS: 0 10*3/uL (ref 0.0–0.1)
Basophils Relative: 0 %
EOS ABS: 0.1 10*3/uL (ref 0.0–0.7)
EOS PCT: 1 %
Lymphocytes Relative: 12 %
Lymphs Abs: 1.8 10*3/uL (ref 0.7–4.0)
Monocytes Absolute: 0.7 10*3/uL (ref 0.1–1.0)
Monocytes Relative: 5 %
NEUTROS PCT: 82 %
Neutro Abs: 12.2 10*3/uL — ABNORMAL HIGH (ref 1.7–7.7)

## 2016-05-27 LAB — CBC
HCT: 42.6 % (ref 39.0–52.0)
Hemoglobin: 14.4 g/dL (ref 13.0–17.0)
MCH: 29.8 pg (ref 26.0–34.0)
MCHC: 33.8 g/dL (ref 30.0–36.0)
MCV: 88 fL (ref 78.0–100.0)
PLATELETS: 171 10*3/uL (ref 150–400)
RBC: 4.84 MIL/uL (ref 4.22–5.81)
RDW: 12.4 % (ref 11.5–15.5)
WBC: 14.7 10*3/uL — ABNORMAL HIGH (ref 4.0–10.5)

## 2016-05-27 LAB — I-STAT TROPONIN, ED: Troponin i, poc: 0 ng/mL (ref 0.00–0.08)

## 2016-05-27 LAB — URINALYSIS, ROUTINE W REFLEX MICROSCOPIC
BACTERIA UA: NONE SEEN
BILIRUBIN URINE: NEGATIVE
GLUCOSE, UA: 150 mg/dL — AB
Ketones, ur: 20 mg/dL — AB
LEUKOCYTES UA: NEGATIVE
NITRITE: NEGATIVE
PH: 7 (ref 5.0–8.0)
Protein, ur: 100 mg/dL — AB
SPECIFIC GRAVITY, URINE: 1.013 (ref 1.005–1.030)
Squamous Epithelial / LPF: NONE SEEN

## 2016-05-27 LAB — PROTIME-INR
INR: 3
PROTHROMBIN TIME: 31.8 s — AB (ref 11.4–15.2)

## 2016-05-27 LAB — I-STAT CHEM 8, ED
BUN: 19 mg/dL (ref 6–20)
CALCIUM ION: 1.05 mmol/L — AB (ref 1.15–1.40)
Chloride: 99 mmol/L — ABNORMAL LOW (ref 101–111)
Creatinine, Ser: 0.8 mg/dL (ref 0.61–1.24)
Glucose, Bld: 210 mg/dL — ABNORMAL HIGH (ref 65–99)
HEMATOCRIT: 44 % (ref 39.0–52.0)
Hemoglobin: 15 g/dL (ref 13.0–17.0)
POTASSIUM: 3.3 mmol/L — AB (ref 3.5–5.1)
SODIUM: 139 mmol/L (ref 135–145)
TCO2: 26 mmol/L (ref 0–100)

## 2016-05-27 LAB — CBG MONITORING, ED: GLUCOSE-CAPILLARY: 210 mg/dL — AB (ref 65–99)

## 2016-05-27 LAB — APTT: APTT: 51 s — AB (ref 24–36)

## 2016-05-27 MED ORDER — SODIUM CHLORIDE 0.9 % IV BOLUS (SEPSIS)
500.0000 mL | Freq: Once | INTRAVENOUS | Status: AC
  Administered 2016-05-27: 500 mL via INTRAVENOUS

## 2016-05-27 MED ORDER — FENTANYL CITRATE (PF) 100 MCG/2ML IJ SOLN
50.0000 ug | INTRAMUSCULAR | Status: DC | PRN
  Administered 2016-05-27: 50 ug via INTRAVENOUS
  Filled 2016-05-27: qty 2

## 2016-05-27 MED ORDER — LIDOCAINE-EPINEPHRINE 1 %-1:100000 IJ SOLN
20.0000 mL | Freq: Once | INTRAMUSCULAR | Status: AC
  Administered 2016-05-27: 20 mL via INTRADERMAL

## 2016-05-27 MED ORDER — POTASSIUM CHLORIDE CRYS ER 20 MEQ PO TBCR
40.0000 meq | EXTENDED_RELEASE_TABLET | Freq: Once | ORAL | Status: AC
  Administered 2016-05-28: 40 meq via ORAL
  Filled 2016-05-27: qty 2

## 2016-05-27 MED ORDER — LIDOCAINE-EPINEPHRINE (PF) 1 %-1:200000 IJ SOLN: 20.0000 mL | Freq: Once | INTRAMUSCULAR | Status: DC

## 2016-05-27 MED ORDER — DEXTROSE 5 % IV SOLN
10.0000 mg | INTRAVENOUS | Status: AC
  Administered 2016-05-27: 10 mg via INTRAVENOUS
  Filled 2016-05-27: qty 1

## 2016-05-27 MED ORDER — PROTHROMBIN COMPLEX CONC HUMAN 500 UNITS IV KIT
1500.0000 [IU] | PACK | INTRAVENOUS | Status: AC
  Administered 2016-05-27: 1500 [IU] via INTRAVENOUS
  Filled 2016-05-27: qty 60

## 2016-05-27 NOTE — ED Notes (Signed)
Canceled code stroke per Dr. Nicole Kindred

## 2016-05-27 NOTE — ED Notes (Signed)
ACTIVATED CODE STROKE PER DR.[email or web address] @ 20:32

## 2016-05-27 NOTE — Consult Note (Signed)
Reason for Consult facial fracture and laceration Referring Physician: ER  Raymond Navarro is an 82 y.o. male.  HPI: Presents to ER with unwitnessed fall and currently the etiology is unclear. He has some blood in his head and neurosurgery has been consulted. He has lacerations on his face. He complains about hip pain but no facial pain. He has no malocclusion. His nose doesn't seem to be obstructed. His vision seems to be intact and he does not complain about any diplopia. He is on Coumadin.  Past Medical History:  Diagnosis Date  . Anemia   . Atrial fibrillation, chronic ([place])   . Cancer (HCC)    hx of skin cancer  . Colitis    Hx of  . Diabetes mellitus    type 2  . Dysrhythmia    ATRIAL FIBRILATION  . History of chicken pox   . Hyperlipidemia   . Hypertension   . Incontinence of feces   . Parkinson disease ([place])   . Presence of permanent cardiac pacemaker 01/11/12   [employer] [place] Single chamber, Serial # [id number]  . Vitamin D deficiency   . Wears hearing aid    bilateral    Past Surgical History:  Procedure Laterality Date  . APPENDECTOMY    . CHOLECYSTECTOMY  1972  . COLONOSCOPY WITH PROPOFOL N/A 12/02/2015   Procedure: COLONOSCOPY WITH PROPOFOL;  Surgeon: Lollie Sails, MD;  Location: [facility] ENDOSCOPY;  Service: Endoscopy;  Laterality: N/A;  . COLONOSCOPY WITH PROPOFOL N/A 12/06/2015   Procedure: COLONOSCOPY WITH PROPOFOL;  Surgeon: Lollie Sails, MD;  Location: [facility] ENDOSCOPY;  Service: Endoscopy;  Laterality: N/A;  . FRACTURE SURGERY    . hip surgery    . PERMANENT PACEMAKER INSERTION N/A 01/11/2012   Procedure: PERMANENT PACEMAKER INSERTION;  Surgeon: Evans Lance, MD;  Location: [facility] CATH LAB;  Service: Cardiovascular;  Laterality: N/A;  . TONSILLECTOMY  1972  . VASECTOMY  1980    Family History  Problem Relation Age of Onset  . Family history unknown: Yes    Social History:  reports that he has never smoked. He has never used smokeless tobacco. He  reports that he does not drink alcohol or use drugs.  Allergies:  Allergies  Allergen Reactions  . Niacin And Related Itching and Rash    Medications: I have reviewed the patient's current medications.  Results for orders placed or performed during the hospital encounter of 05/27/16 (from the past 48 hour(s))  CBG monitoring, ED     Status: Abnormal   Collection Time: 05/27/16  8:35 PM  Result Value Ref Range   Glucose-Capillary 210 (H) 65 - 99 mg/dL  Protime-INR     Status: Abnormal   Collection Time: 05/27/16  8:36 PM  Result Value Ref Range   Prothrombin Time 31.8 (H) 11.4 - 15.2 seconds   INR 3.00   APTT     Status: Abnormal   Collection Time: 05/27/16  8:36 PM  Result Value Ref Range   aPTT 51 (H) 24 - 36 seconds    Comment:        IF BASELINE aPTT IS ELEVATED, SUGGEST PATIENT RISK ASSESSMENT BE USED TO DETERMINE APPROPRIATE ANTICOAGULANT THERAPY.   CBC     Status: Abnormal   Collection Time: 05/27/16  8:36 PM  Result Value Ref Range   WBC 14.7 (H) 4.0 - 10.5 K/uL   RBC 4.84 4.22 - 5.81 MIL/uL   Hemoglobin 14.4 13.0 - 17.0 g/dL   HCT 42.6  39.0 - 52.0 %   MCV 88.0 78.0 - 100.0 fL   MCH 29.8 26.0 - 34.0 pg   MCHC 33.8 30.0 - 36.0 g/dL   RDW 12.4 11.5 - 15.5 %   Platelets 171 150 - 400 K/uL  Differential     Status: Abnormal   Collection Time: 05/27/16  8:36 PM  Result Value Ref Range   Neutrophils Relative % 82 %   Neutro Abs 12.2 (H) 1.7 - 7.7 K/uL   Lymphocytes Relative 12 %   Lymphs Abs 1.8 0.7 - 4.0 K/uL   Monocytes Relative 5 %   Monocytes Absolute 0.7 0.1 - 1.0 K/uL   Eosinophils Relative 1 %   Eosinophils Absolute 0.1 0.0 - 0.7 K/uL   Basophils Relative 0 %   Basophils Absolute 0.0 0.0 - 0.1 K/uL  Comprehensive metabolic panel     Status: Abnormal   Collection Time: 05/27/16  8:36 PM  Result Value Ref Range   Sodium 132 (L) 135 - 145 mmol/L   Potassium 3.0 (L) 3.5 - 5.1 mmol/L   Chloride 95 (L) 101 - 111 mmol/L   CO2 27 22 - 32 mmol/L   Glucose,  Bld 212 (H) 65 - 99 mg/dL   BUN 16 6 - 20 mg/dL   Creatinine, Ser 1.02 0.61 - 1.24 mg/dL   Calcium 8.7 (L) 8.9 - 10.3 mg/dL   Total Protein 6.6 6.5 - 8.1 g/dL   Albumin 4.0 3.5 - 5.0 g/dL   AST 29 15 - 41 U/L   ALT 7 (L) 17 - 63 U/L   Alkaline Phosphatase 87 38 - 126 U/L   Total Bilirubin 1.2 0.3 - 1.2 mg/dL   GFR calc non Af Amer >60 >60 mL/min   GFR calc Af Amer >60 >60 mL/min    Comment: (NOTE) The eGFR has been calculated using the CKD EPI equation. This calculation has not been validated in all clinical situations. eGFR's persistently <60 mL/min signify possible Chronic Kidney Disease.    Anion gap 10 5 - 15  I-stat troponin, ED     Status: None   Collection Time: 05/27/16  8:46 PM  Result Value Ref Range   Troponin i, poc 0.00 0.00 - 0.08 ng/mL   Comment 3            Comment: Due to the release kinetics of cTnI, a negative result within the first hours of the onset of symptoms does not rule out myocardial infarction with certainty. If myocardial infarction is still suspected, repeat the test at appropriate intervals.   I-Stat Chem 8, ED     Status: Abnormal   Collection Time: 05/27/16  8:48 PM  Result Value Ref Range   Sodium 139 135 - 145 mmol/L   Potassium 3.3 (L) 3.5 - 5.1 mmol/L   Chloride 99 (L) 101 - 111 mmol/L   BUN 19 6 - 20 mg/dL   Creatinine, Ser 0.80 0.61 - 1.24 mg/dL   Glucose, Bld 210 (H) 65 - 99 mg/dL   Calcium, Ion 1.05 (L) 1.15 - 1.40 mmol/L   TCO2 26 0 - 100 mmol/L   Hemoglobin 15.0 13.0 - 17.0 g/dL   HCT 44.0 39.0 - 52.0 %    Ct Cervical Spine Wo Contrast  Result Date: 05/27/2016 CLINICAL DATA:  Patient found down today. Hematoma above the left eye. Initial encounter. EXAM: CT CERVICAL SPINE WITHOUT CONTRAST TECHNIQUE: Multidetector CT imaging of the cervical spine was performed without intravenous contrast. Multiplanar CT image reconstructions  were also generated. COMPARISON:  None. FINDINGS: Alignment: Trace anterolisthesis C7 on T1 due to  facet degenerative disease is noted. Alignment is otherwise maintained. Skull base and vertebrae: No acute fracture. No primary bone lesion or focal pathologic process. Soft tissues and spinal canal: No prevertebral fluid or swelling. No visible canal hematoma. Disc levels: Mild loss of disc space height and endplate spurring are seen at C6-7. Multilevel facet arthropathy is seen. No obvious central canal or foraminal stenosis. Upper chest: Lung apices are clear. Other: None. IMPRESSION: No acute abnormality.  Mild degenerative disease noted. Electronically Signed   By: Inge Rise M.D.   On: 05/27/2016 21:12   Ct Head Code Stroke W/o Cm  Result Date: 05/27/2016 CLINICAL DATA:  Code stroke. 82 year old male found down. Diabetes. Parkinson's disease. Skin cancer. Initial encounter. EXAM: CT HEAD WITHOUT CONTRAST TECHNIQUE: Contiguous axial images were obtained from the base of the skull through the vertex without intravenous contrast. COMPARISON:  01/20/2011 MR.  06/15/2010 CT. FINDINGS: Brain: Posterior left frontal lobe small hemorrhagic contusion measuring up to 8 mm. Small amount of subarachnoid blood anterior right frontal region. Prominent chronic microvascular changes without CT evidence of large acute infarct. Global atrophy without hydrocephalus. No intracranial mass lesion noted on this unenhanced exam. Vascular: No hyperdense vessel.  Vascular calcifications. Skull: No skull fracture Sinuses/Orbits: Left preseptal moderately large hematoma. The underlying globe appears to be grossly intact however there is a left orbital floor fracture with slight displacement. No entrapment of the inferior rectus muscle. Other: Negative ASPECTS ([place] Stroke Program Early CT Score) - Ganglionic level infarction (caudate, lentiform nuclei, internal capsule, insula, M1-M3 cortex): 7 - Supraganglionic infarction (M4-M6 cortex): 3 Total score (0-10 with 10 being normal): 10 IMPRESSION: 1. Posterior left frontal  lobe small hemorrhagic contusion measuring up to 8 mm. Small amount of subarachnoid blood anterior right frontal region. Prominent chronic microvascular changes without CT evidence of large acute infarct. Global atrophy without hydrocephalus. Left preseptal moderately large hematoma. The underlying globe appears to be grossly intact however there is a left orbital floor fracture with slight displacement. No entrapment of the inferior rectus muscle. 1. ASPECTS is 10 These results were called by telephone at the time of interpretation on 05/27/2016 at 8:54 Pm to Dr. Elnora Morrison , who verbally acknowledged these results. Upon initially calling Dr. Nicole Kindred, code stroke was canceled. Electronically Signed   By: Genia Del M.D.   On: 05/27/2016 21:07   Ct Maxillofacial Wo Contrast  Result Date: 05/27/2016 CLINICAL DATA:  Recent fall EXAM: CT MAXILLOFACIAL WITHOUT CONTRAST TECHNIQUE: Multidetector CT imaging of the maxillofacial structures was performed. Multiplanar CT image reconstructions were also generated. A small metallic BB was placed on the right temple in order to reliably differentiate right from left. COMPARISON:  None. FINDINGS: Osseous: Degenerative changes of the cervical spine are noted. Mild blowout fracture of the inferior orbital wall on the left is noted. No muscular entrapment is noted although the inferior rectus muscle lies adjacent to the bony defect. Orbits: Postsurgical changes are noted in both globes. Sinuses: Mild mucosal changes are noted in the left maxillary antrum consistent with the adjacent blowout fracture. Soft tissues: Considerable soft tissue swelling is noted in the left periorbital region. No other soft tissue abnormality is noted. Limited intracranial: Atrophic changes are noted. IMPRESSION: Left inferior orbital wall blowout fracture with only mild displacement of the inferior fracture fragment. The left inferior rectus approaches the bony defect although does not appear  entrapped. The muscle is  somewhat edematous. Significant left periorbital soft tissue swelling. No other focal abnormality is noted. Electronically Signed   By: Inez Catalina M.D.   On: 05/27/2016 21:22    Review of Systems  Constitutional: Negative.   HENT: Negative.   Eyes: Negative.   Respiratory: Negative.   Cardiovascular: Negative.   Skin: Negative.    Blood pressure 181/93, pulse 92, resp. rate 22, height _0  (1.778 m), weight 62.6 kg (138 lb), SpO2 95 %. Physical Exam   Alert and awake Face: He has a laceration of 3 cm on the left brow and a 2 cm laceration of the upper left lip. Both are still oozing. His orbital rims are intact. Facial nerve is intact. Eyes: Pupils equally round and reactive to light extraocular motor muscles are intact, he doesn't have any diplopia by this exam. His vision seems to be intact. Ears: No evidence of any lesions Nose: No swelling or drainage. Septum seems to be midline. The external nose has no deformity or inflammation. Oral cavity/oropharynx: He is bleeding from the left lip. The occlusion looks good with no evidence of intraoral injury, No evidence of any erythema or lesions. Tongue looks normal. Neck: Has a c-collar in place Neurologic: No evidence of focal lesions or abnormalities. resp: normal effort Cv: Regular Ext: no tenderness or swelling Psych: no obvious issues  Assessment/Plan: Facial lacerations and left orbital blowout fracture-his lacerations are both complex were closed after prepping and draping in the usual sterile manner the wounds were irrigated with saline and Betadine. He was injected with 1% lidocaine with 1 100,000 epinephrine in both locations. They were closed both the left 3 cm eyebrow laceration and the 2 cm lip laceration. There are closed in layers with 4-0 chromic and then the eyebrow was closed with running 4-0 nylon. He tolerated this well. The bleeding was controlled. I reviewed his CT scan. He has a left  orbital blowout fracture which is not seen the have a large defect and he appears to have no current evidence of diplopia. I talked to the wife, sister-in-law as well as the patient regarding this fracture and most likely this will not need to be repaired. He can follow-up in one week for reexamination in the office.  Melissa Montane 05/27/2016, 10:10 PM

## 2016-05-27 NOTE — ED Provider Notes (Signed)
[facility] DEPT Provider Note   CSN: [id number] Arrival date & time: 05/27/16  2024     History   Chief Complaint Chief Complaint  Patient presents with  . Fall  . Altered Mental Status    HPI Raymond Navarro is a 82 y.o. male.  Patient with history of Parkinson's, high blood pressure, diabetes, atrial fibrillation on Coumadin presents after unwitnessed fall. Patient does fall fairly frequently per family that arrived later however this was unwitnessed. Patient does not recall event. No chest pain or shortness of breath. Family was shopping and cannot get a hold of patient. Phone second home and found him on the ground by table blood on his face. Patient has tenderness to palpation.      Past Medical History:  Diagnosis Date  . Anemia   . Atrial fibrillation, chronic ([place])   . Cancer (HCC)    hx of skin cancer  . Colitis    Hx of  . Diabetes mellitus    type 2  . Dysrhythmia    ATRIAL FIBRILATION  . History of chicken pox   . Hyperlipidemia   . Hypertension   . Incontinence of feces   . Parkinson disease ([place])   . Presence of permanent cardiac pacemaker 01/11/12   Medtronic [place] Single chamber, Serial # [id number]  . Vitamin D deficiency   . Wears hearing aid    bilateral    Patient Active Problem List   Diagnosis Date Noted  . Preop cardiovascular exam 01/09/2013  . Atrial fibrillation ([place]) 04/10/2012  . Pacemaker-Medtronic 01/12/2012    Past Surgical History:  Procedure Laterality Date  . APPENDECTOMY    . CHOLECYSTECTOMY  1972  . COLONOSCOPY WITH PROPOFOL N/A 12/02/2015   Procedure: COLONOSCOPY WITH PROPOFOL;  Surgeon: Lollie Sails, MD;  Location[facility] ENDOSCOPY;  Service: Endoscopy;  Laterality: N/A;  . COLONOSCOPY WITH PROPOFOL N/A 12/06/2015   Procedure: COLONOSCOPY WITH PROPOFOL;  Surgeon: Lollie Sails, MD;  Location: [facility] ENDOSCOPY;  Service: Endoscopy;  Laterality: N/A;  . FRACTURE SURGERY    . hip surgery    . PERMANENT  PACEMAKER INSERTION N/A 01/11/2012   Procedure: PERMANENT PACEMAKER INSERTION;  Surgeon: Evans Lance, MD;  Location: [facility] CATH LAB;  Service: Cardiovascular;  Laterality: N/A;  . TONSILLECTOMY  1972  . VASECTOMY  1980       Home Medications    Prior to Admission medications   Medication Sig Start Date End Date Taking? Authorizing Provider  acetaminophen (TYLENOL) 500 MG tablet Take 1,000 mg by mouth every 6 (six) hours as needed (pain).   Yes Historical Provider, MD  alfuzosin (UROXATRAL) 10 MG 24 hr tablet Take 10 mg by mouth daily. 05/20/16  Yes Historical Provider, MD  amoxicillin (AMOXIL) 500 MG capsule Take 2,000 mg by mouth See admin instructions. Take 4 capsules (2000 mg) by mouth one hour before dental appointments   Yes Historical Provider, MD  b complex vitamins tablet Take 1 tablet by mouth daily.   Yes Historical Provider, MD  brimonidine (ALPHAGAN) 0.2 % ophthalmic solution Place 2 drops into both eyes 2 (two) times daily. 05/20/16  Yes Historical Provider, MD  carbidopa-levodopa (SINEMET IR) 25-100 MG tablet Take 1 tablet by mouth 3 (three) times daily.   Yes Historical Provider, MD  Cholecalciferol (VITAMIN D PO) Take 1 tablet by mouth daily.   Yes Historical Provider, MD  diltiazem (CARDIZEM CD) 180 MG 24 hr capsule Take 180 mg by mouth daily. 05/20/16  Yes  Historical Provider, MD  glimepiride (AMARYL) 4 MG tablet Take 4 mg by mouth 2 (two) times daily.    Yes Historical Provider, MD  metFORMIN (GLUCOPHAGE-XR) 500 MG 24 hr tablet Take 500 mg by mouth 2 (two) times daily.   Yes Historical Provider, MD  pravastatin (PRAVACHOL) 20 MG tablet Take 20 mg by mouth at bedtime.    Yes Historical Provider, MD  sertraline (ZOLOFT) 50 MG tablet Take 50 mg by mouth daily. 05/22/16  Yes Historical Provider, MD  sitaGLIPtin (JANUVIA) 50 MG tablet Take 50 mg by mouth daily.   Yes Historical Provider, MD  warfarin (COUMADIN) 5 MG tablet Take 5 mg by mouth daily.    Yes Historical Provider, MD     Family History Family History  Problem Relation Age of Onset  . Family history unknown: Yes    Social History Social History  Substance Use Topics  . Smoking status: Never Smoker  . Smokeless tobacco: Never Used  . Alcohol use No     Comment: occasional     Allergies   Niacin and related   Review of Systems Review of Systems  Constitutional: Negative for chills and fever.  HENT: Negative for congestion.   Eyes: Negative for visual disturbance.  Respiratory: Negative for shortness of breath.   Cardiovascular: Negative for chest pain.  Gastrointestinal: Negative for abdominal pain and vomiting.  Genitourinary: Negative for dysuria and flank pain.  Musculoskeletal: Positive for arthralgias. Negative for back pain, neck pain and neck stiffness.  Skin: Positive for wound. Negative for rash.  Neurological: Positive for headaches. Negative for light-headedness.     Physical Exam Updated Vital Signs BP 158/86   Pulse 93   Resp 21   Ht 5\' 10"  (1.778 m)   Wt 138 lb (62.6 kg)   SpO2 96%   BMI 19.80 kg/m   Physical Exam  Constitutional: He appears well-developed and well-nourished.  HENT:  Head: Normocephalic.  Patient has mild tenderness periorbital on the left with edema and laceration of the eyebrow please see specialist report for further details. Patient has laceration to left upper lip as well.  Eyes: Conjunctivae are normal.  Neck: Neck supple.  Cardiovascular: Normal rate and regular rhythm.   Pulmonary/Chest: Effort normal and breath sounds normal. No respiratory distress.  Abdominal: Soft. There is no tenderness.  Musculoskeletal: He exhibits edema and tenderness.  Mild tenderness with flexion of left hip.  Neurological: He is alert.  Patient moves all extremities equal bilateral. Gross sensation intact palpation. Patient alert and oriented 2 mild general confusion. No focal deficits. No facial droop. Pupils equal bilateral with periorbital edema on the  left.  Skin: Skin is warm and dry.  Psychiatric:  Mild general confusion  Nursing note and vitals reviewed.    ED Treatments / Results  Labs (all labs ordered are listed, but only abnormal results are displayed) Labs Reviewed  PROTIME-INR - Abnormal; Notable for the following:       Result Value   Prothrombin Time 31.8 (*)    All other components within normal limits  APTT - Abnormal; Notable for the following:    aPTT 51 (*)    All other components within normal limits  CBC - Abnormal; Notable for the following:    WBC 14.7 (*)    All other components within normal limits  DIFFERENTIAL - Abnormal; Notable for the following:    Neutro Abs 12.2 (*)    All other components within normal limits  COMPREHENSIVE METABOLIC PANEL -  Abnormal; Notable for the following:    Sodium 132 (*)    Potassium 3.0 (*)    Chloride 95 (*)    Glucose, Bld 212 (*)    Calcium 8.7 (*)    ALT 7 (*)    All other components within normal limits  CBG MONITORING, ED - Abnormal; Notable for the following:    Glucose-Capillary 210 (*)    All other components within normal limits  I-STAT CHEM 8, ED - Abnormal; Notable for the following:    Potassium 3.3 (*)    Chloride 99 (*)    Glucose, Bld 210 (*)    Calcium, Ion 1.05 (*)    All other components within normal limits  PROTIME-INR  URINALYSIS, ROUTINE W REFLEX MICROSCOPIC  I-STAT TROPOININ, ED    EKG  EKG Interpretation  Date/Time:  Saturday May 27 2016 20:27:52 EST Ventricular Rate:  88 PR Interval:    QRS Duration: 91 QT Interval:  385 QTC Calculation: 466 R Axis:   73 Text Interpretation:  Sinus rhythm Borderline repolarization abnormality Baseline wander in lead(s) V2 Confirmed by Viktorya Arguijo MD, Eyanna Mcgonagle [phone]) on 05/27/2016 9:41:10 PM       Radiology Ct Cervical Spine Wo Contrast  Result Date: 05/27/2016 CLINICAL DATA:  Patient found down today. Hematoma above the left eye. Initial encounter. EXAM: CT CERVICAL SPINE WITHOUT  CONTRAST TECHNIQUE: Multidetector CT imaging of the cervical spine was performed without intravenous contrast. Multiplanar CT image reconstructions were also generated. COMPARISON:  None. FINDINGS: Alignment: Trace anterolisthesis C7 on T1 due to facet degenerative disease is noted. Alignment is otherwise maintained. Skull base and vertebrae: No acute fracture. No primary bone lesion or focal pathologic process. Soft tissues and spinal canal: No prevertebral fluid or swelling. No visible canal hematoma. Disc levels: Mild loss of disc space height and endplate spurring are seen at C6-7. Multilevel facet arthropathy is seen. No obvious central canal or foraminal stenosis. Upper chest: Lung apices are clear. Other: None. IMPRESSION: No acute abnormality.  Mild degenerative disease noted. Electronically Signed   By: Inge Rise M.D.   On: 05/27/2016 21:12   Dg Pelvis Portable  Result Date: 05/27/2016 CLINICAL DATA:  Fall and pain.  Initial encounter. EXAM: PORTABLE PELVIS 1-2 VIEWS COMPARISON:  01/22/2013 FINDINGS: AP view of the pelvis. Right hip arthroplasty. No acute complication. Sacroiliac joints are symmetric. IMPRESSION: No acute osseous abnormality. Electronically Signed   By: Abigail Miyamoto M.D.   On: 05/27/2016 22:40   Dg Chest Portable 1 View  Result Date: 05/27/2016 CLINICAL DATA:  Status post fall with rib and head pain. EXAM: PORTABLE CHEST 1 VIEW COMPARISON:  Chest radiograph 07/13/2012 FINDINGS: Single lead left chest wall pacemaker lead overlies the right ventricle. There is atherosclerotic calcification within the aortic arch. Cardiomediastinal size is otherwise normal. There are scattered bilateral interstitial opacities without overt pulmonary edema. No focal airspace consolidation. No pleural effusion or pneumothorax. No acute osseous abnormality. IMPRESSION: 1. No focal airspace consolidation. 2. Aortic atherosclerosis. 3. No rib fracture. Electronically Signed   By: Ulyses Jarred M.D.    On: 05/27/2016 22:43   Ct Head Code Stroke W/o Cm  Result Date: 05/27/2016 CLINICAL DATA:  Code stroke. 82 year old male found down. Diabetes. Parkinson's disease. Skin cancer. Initial encounter. EXAM: CT HEAD WITHOUT CONTRAST TECHNIQUE: Contiguous axial images were obtained from the base of the skull through the vertex without intravenous contrast. COMPARISON:  01/20/2011 MR.  06/15/2010 CT. FINDINGS: Brain: Posterior left frontal lobe small hemorrhagic contusion measuring up  to 8 mm. Small amount of subarachnoid blood anterior right frontal region. Prominent chronic microvascular changes without CT evidence of large acute infarct. Global atrophy without hydrocephalus. No intracranial mass lesion noted on this unenhanced exam. Vascular: No hyperdense vessel.  Vascular calcifications. Skull: No skull fracture Sinuses/Orbits: Left preseptal moderately large hematoma. The underlying globe appears to be grossly intact however there is a left orbital floor fracture with slight displacement. No entrapment of the inferior rectus muscle. Other: Negative ASPECTS ([place] Stroke Program Early CT Score) - Ganglionic level infarction (caudate, lentiform nuclei, internal capsule, insula, M1-M3 cortex): 7 - Supraganglionic infarction (M4-M6 cortex): 3 Total score (0-10 with 10 being normal): 10 IMPRESSION: 1. Posterior left frontal lobe small hemorrhagic contusion measuring up to 8 mm. Small amount of subarachnoid blood anterior right frontal region. Prominent chronic microvascular changes without CT evidence of large acute infarct. Global atrophy without hydrocephalus. Left preseptal moderately large hematoma. The underlying globe appears to be grossly intact however there is a left orbital floor fracture with slight displacement. No entrapment of the inferior rectus muscle. 1. ASPECTS is 10 These results were called by telephone at the time of interpretation on 05/27/2016 at 8:54 Pm to Dr. Elnora Morrison , who verbally  acknowledged these results. Upon initially calling Dr. Nicole Kindred, code stroke was canceled. Electronically Signed   By: Genia Del M.D.   On: 05/27/2016 21:07   Ct Maxillofacial Wo Contrast  Result Date: 05/27/2016 CLINICAL DATA:  Recent fall EXAM: CT MAXILLOFACIAL WITHOUT CONTRAST TECHNIQUE: Multidetector CT imaging of the maxillofacial structures was performed. Multiplanar CT image reconstructions were also generated. A small metallic BB was placed on the right temple in order to reliably differentiate right from left. COMPARISON:  None. FINDINGS: Osseous: Degenerative changes of the cervical spine are noted. Mild blowout fracture of the inferior orbital wall on the left is noted. No muscular entrapment is noted although the inferior rectus muscle lies adjacent to the bony defect. Orbits: Postsurgical changes are noted in both globes. Sinuses: Mild mucosal changes are noted in the left maxillary antrum consistent with the adjacent blowout fracture. Soft tissues: Considerable soft tissue swelling is noted in the left periorbital region. No other soft tissue abnormality is noted. Limited intracranial: Atrophic changes are noted. IMPRESSION: Left inferior orbital wall blowout fracture with only mild displacement of the inferior fracture fragment. The left inferior rectus approaches the bony defect although does not appear entrapped. The muscle is somewhat edematous. Significant left periorbital soft tissue swelling. No other focal abnormality is noted. Electronically Signed   By: Inez Catalina M.D.   On: 05/27/2016 21:22    Procedures Procedures (including critical care time) CRITICAL CARE Performed by: Mariea Clonts   Total critical care time: 40 minutes  Critical care time was exclusive of separately billable procedures and treating other patients.  Critical care was necessary to treat or prevent imminent or life-threatening deterioration.  Critical care was time spent personally by me on the  following activities: development of treatment plan with patient and/or surrogate as well as nursing, discussions with consultants, evaluation of patient's response to treatment, examination of patient, obtaining history from patient or surrogate, ordering and performing treatments and interventions, ordering and review of laboratory studies, ordering and review of radiographic studies, pulse oximetry and re-evaluation of patient's condition.  Medications Ordered in ED Medications  potassium chloride SA (K-DUR,KLOR-CON) CR tablet 40 mEq (not administered)  fentaNYL (SUBLIMAZE) injection 50 mcg (50 mcg Intravenous Given 05/27/16 2300)  prothrombin complex conc human (  KCENTRA) IVPB 1,500 Units (1,500 Units Intravenous Given 05/27/16 2202)  phytonadione (VITAMIN K) 10 mg in dextrose 5 % 50 mL IVPB (0 mg Intravenous Stopped 05/27/16 2307)  lidocaine-EPINEPHrine (XYLOCAINE W/EPI) 1 %-1:100000 (with pres) injection 20 mL (20 mLs Intradermal Given 05/27/16 2207)  sodium chloride 0.9 % bolus 500 mL (500 mLs Intravenous New Bag/Given 05/27/16 2312)     Initial Impression / Assessment and Plan / ED Course  I have reviewed the triage vital signs and the nursing notes.  Pertinent labs & imaging results that were available during my care of the patient were reviewed by me and considered in my medical decision making (see chart for details).  Clinical Course    Patient presents after unwitnessed event and significant head injury. Clinical concern for intracranial bleeding with patient on blood thinners and trauma. Discussed the results with radiology showing intracranial hemorrhagic contusion along with mild subarachnoid hemorrhage traumatic. Reversal of Coumadin ordered with [facility]. Updated family. Discussed with trauma face for consult who repaired lacerations.  Patient improved on reassessment. Discussed plan with family. Paged triad hospitalist for observation. Neurosurgery recommended repeat CT scan in  the morning.   Results and differential diagnosis were discussed with the patient/parent/guardian. Xrays were independently reviewed by myself.  Close follow up outpatient was discussed, comfortable with the plan.   Medications  potassium chloride SA (K-DUR,KLOR-CON) CR tablet 40 mEq (not administered)  fentaNYL (SUBLIMAZE) injection 50 mcg (50 mcg Intravenous Given 05/27/16 2300)  prothrombin complex conc human (KCENTRA) IVPB 1,500 Units (1,500 Units Intravenous Given 05/27/16 2202)  phytonadione (VITAMIN K) 10 mg in dextrose 5 % 50 mL IVPB (0 mg Intravenous Stopped 05/27/16 2307)  lidocaine-EPINEPHrine (XYLOCAINE W/EPI) 1 %-1:100000 (with pres) injection 20 mL (20 mLs Intradermal Given 05/27/16 2207)  sodium chloride 0.9 % bolus 500 mL (500 mLs Intravenous New Bag/Given 05/27/16 2312)    Vitals:   05/27/16 2215 05/27/16 2245 05/27/16 2300 05/27/16 2315  BP: 174/94 173/91 154/85 158/86  Pulse: 95 97 96 93  Resp: 22 22 22 21   SpO2: 96% 94% 90% 96%  Weight:      Height:        Final diagnoses:  Focal hemorrhagic contusion of cerebrum (HCC)  Traumatic subarachnoid bleed with LOC of 30 minutes or less, initial encounter (HCC)  Atrial fibrillation, chronic (HCC)  Left hip pain  Closed fracture of left orbital floor, initial encounter (HCC)  Facial laceration, initial encounter   . Final Clinical Impressions(s) / ED Diagnoses   Final diagnoses:  Focal hemorrhagic contusion of cerebrum ([place])  Traumatic subarachnoid bleed with LOC of 30 minutes or less, initial encounter [facility])  Atrial fibrillation, chronic (HCC)  Left hip pain  Closed fracture of left orbital floor, initial encounter [facility])  Facial laceration, initial encounter    New Prescriptions New Prescriptions   No medications on file     Elnora Morrison, MD 05/27/16 2351

## 2016-05-27 NOTE — ED Notes (Signed)
CANCELED CODE STROKE PER DR.ZAVITZ AND DR.STEWART NEURO. @ 20:38

## 2016-05-27 NOTE — ED Triage Notes (Signed)
Pt lives with wife in independent living and was last seen normal 1830. Found at home down in floor. Several lacerations and baseline A+Ox4. Pt A+Ox2 now.

## 2016-05-27 NOTE — Progress Notes (Signed)
Patient ID: Raymond Navarro, male   DOB: 08-07-1928, 82 y.o.   MRN: [id number] I was called to look at the CT head on this gentleman after a syncopal spell and fall today. Mild confusion per EDP. CT head shows tiny L parietal hyperdensity c/w contusion or SAH, no mass effect or shift. This is of little clinical concern, and the likelihood of clinically significant enlargement is likely small.  Rec: admit to medicine to observe and w/u syncope, repeat head CT in am or if MS changes, reverse and hold anti-coagulants for 5 days

## 2016-05-28 ENCOUNTER — Observation Stay (HOSPITAL_COMMUNITY): Payer: Medicare Other

## 2016-05-28 DIAGNOSIS — E785 Hyperlipidemia, unspecified: Secondary | ICD-10-CM | POA: Diagnosis present

## 2016-05-28 DIAGNOSIS — S0232XA Fracture of orbital floor, left side, initial encounter for closed fracture: Secondary | ICD-10-CM | POA: Diagnosis present

## 2016-05-28 DIAGNOSIS — E876 Hypokalemia: Secondary | ICD-10-CM

## 2016-05-28 DIAGNOSIS — S0181XA Laceration without foreign body of other part of head, initial encounter: Secondary | ICD-10-CM | POA: Diagnosis not present

## 2016-05-28 DIAGNOSIS — I482 Chronic atrial fibrillation: Secondary | ICD-10-CM | POA: Diagnosis not present

## 2016-05-28 DIAGNOSIS — E119 Type 2 diabetes mellitus without complications: Secondary | ICD-10-CM

## 2016-05-28 DIAGNOSIS — Z950 Presence of cardiac pacemaker: Secondary | ICD-10-CM

## 2016-05-28 DIAGNOSIS — S066X1A Traumatic subarachnoid hemorrhage with loss of consciousness of 30 minutes or less, initial encounter: Principal | ICD-10-CM

## 2016-05-28 DIAGNOSIS — G20 Parkinson's disease: Secondary | ICD-10-CM | POA: Diagnosis present

## 2016-05-28 LAB — GLUCOSE, CAPILLARY
GLUCOSE-CAPILLARY: 166 mg/dL — AB (ref 65–99)
Glucose-Capillary: 173 mg/dL — ABNORMAL HIGH (ref 65–99)

## 2016-05-28 LAB — BASIC METABOLIC PANEL
Anion gap: 13 (ref 5–15)
BUN: 16 mg/dL (ref 6–20)
CALCIUM: 8.7 mg/dL — AB (ref 8.9–10.3)
CO2: 23 mmol/L (ref 22–32)
CREATININE: 1.1 mg/dL (ref 0.61–1.24)
Chloride: 99 mmol/L — ABNORMAL LOW (ref 101–111)
GFR calc Af Amer: >60 mL/min (ref >60)
GFR, EST NON AFRICAN AMERICAN: 59 mL/min — AB (ref >60)
Glucose, Bld: 363 mg/dL — ABNORMAL HIGH (ref 65–99)
POTASSIUM: 3.8 mmol/L (ref 3.5–5.1)
SODIUM: 135 mmol/L (ref 135–145)

## 2016-05-28 LAB — CBC
HEMATOCRIT: 42.6 % (ref 39.0–52.0)
HEMOGLOBIN: 13.9 g/dL (ref 13.0–17.0)
MCH: 29.5 pg (ref 26.0–34.0)
MCHC: 32.6 g/dL (ref 30.0–36.0)
MCV: 90.4 fL (ref 78.0–100.0)
Platelets: 174 10*3/uL (ref 150–400)
RBC: 4.71 MIL/uL (ref 4.22–5.81)
RDW: 12.8 % (ref 11.5–15.5)
WBC: 14.9 10*3/uL — ABNORMAL HIGH (ref 4.0–10.5)

## 2016-05-28 LAB — PROTIME-INR
INR: 1.25
INR: 1.26
INR: 1.28
INR: 1.28
PROTHROMBIN TIME: 15.7 s — AB (ref 11.4–15.2)
PROTHROMBIN TIME: 16 s — AB (ref 11.4–15.2)
Prothrombin Time: 15.9 seconds — ABNORMAL HIGH (ref 11.4–15.2)
Prothrombin Time: 16.1 seconds — ABNORMAL HIGH (ref 11.4–15.2)

## 2016-05-28 LAB — CBG MONITORING, ED
GLUCOSE-CAPILLARY: 403 mg/dL — AB (ref 65–99)
Glucose-Capillary: 389 mg/dL — ABNORMAL HIGH (ref 65–99)
Glucose-Capillary: 231 mg/dL — ABNORMAL HIGH (ref 65–99)

## 2016-05-28 LAB — MRSA PCR SCREENING: MRSA BY PCR: NEGATIVE

## 2016-05-28 MED ORDER — ONDANSETRON HCL 4 MG/2ML IJ SOLN: 4.0000 mg | Freq: Four times a day (QID) | INTRAMUSCULAR | Status: DC | PRN

## 2016-05-28 MED ORDER — IPRATROPIUM-ALBUTEROL 0.5-2.5 (3) MG/3ML IN SOLN
3.0000 mL | RESPIRATORY_TRACT | Status: AC
  Administered 2016-05-28: 3 mL via RESPIRATORY_TRACT
  Filled 2016-05-28: qty 3

## 2016-05-28 MED ORDER — DILTIAZEM HCL ER COATED BEADS 180 MG PO CP24
180.0000 mg | ORAL_CAPSULE | Freq: Every day | ORAL | Status: DC
  Administered 2016-05-28 – 2016-06-01 (×5): 180 mg via ORAL
  Filled 2016-05-28 (×6): qty 1

## 2016-05-28 MED ORDER — SODIUM CHLORIDE 0.9 % IV SOLN
INTRAVENOUS | Status: DC
  Administered 2016-05-28 – 2016-05-30 (×4): via INTRAVENOUS

## 2016-05-28 MED ORDER — INSULIN ASPART 100 UNIT/ML ~~LOC~~ SOLN: 0.0000 [IU] | Freq: Every day | SUBCUTANEOUS | Status: DC

## 2016-05-28 MED ORDER — FUROSEMIDE 10 MG/ML IJ SOLN
40.0000 mg | INTRAMUSCULAR | Status: AC
  Administered 2016-05-28: 40 mg via INTRAVENOUS

## 2016-05-28 MED ORDER — CARBIDOPA-LEVODOPA 25-100 MG PO TABS
1.0000 | ORAL_TABLET | Freq: Three times a day (TID) | ORAL | Status: DC
  Administered 2016-05-28 – 2016-06-01 (×13): 1 via ORAL
  Filled 2016-05-28 (×15): qty 1

## 2016-05-28 MED ORDER — ALBUTEROL SULFATE (2.5 MG/3ML) 0.083% IN NEBU: 2.5000 mg | INHALATION_SOLUTION | RESPIRATORY_TRACT | Status: DC | PRN

## 2016-05-28 MED ORDER — INSULIN NPH (HUMAN) (ISOPHANE) 100 UNIT/ML ~~LOC~~ SUSP
10.0000 [IU] | SUBCUTANEOUS | Status: AC
  Administered 2016-05-28: 10 [IU] via SUBCUTANEOUS
  Filled 2016-05-28: qty 10

## 2016-05-28 MED ORDER — ONDANSETRON HCL 4 MG PO TABS: 4.0000 mg | ORAL_TABLET | Freq: Four times a day (QID) | ORAL | Status: DC | PRN

## 2016-05-28 MED ORDER — ACETAMINOPHEN 325 MG PO TABS
650.0000 mg | ORAL_TABLET | Freq: Four times a day (QID) | ORAL | Status: DC | PRN
  Administered 2016-05-30 – 2016-06-01 (×3): 650 mg via ORAL
  Filled 2016-05-28 (×3): qty 2

## 2016-05-28 MED ORDER — ACETAMINOPHEN 650 MG RE SUPP: 650.0000 mg | Freq: Four times a day (QID) | RECTAL | Status: DC | PRN

## 2016-05-28 MED ORDER — BRIMONIDINE TARTRATE 0.2 % OP SOLN
2.0000 [drp] | Freq: Two times a day (BID) | OPHTHALMIC | Status: DC
  Administered 2016-05-28 – 2016-06-01 (×9): 2 [drp] via OPHTHALMIC
  Filled 2016-05-28 (×2): qty 5

## 2016-05-28 MED ORDER — PRAVASTATIN SODIUM 20 MG PO TABS
20.0000 mg | ORAL_TABLET | Freq: Every day | ORAL | Status: DC
  Administered 2016-05-29 – 2016-06-01 (×3): 20 mg via ORAL
  Filled 2016-05-28 (×3): qty 1

## 2016-05-28 MED ORDER — INSULIN ASPART 100 UNIT/ML ~~LOC~~ SOLN
0.0000 [IU] | Freq: Three times a day (TID) | SUBCUTANEOUS | Status: DC
  Administered 2016-05-28: 9 [IU] via SUBCUTANEOUS
  Administered 2016-05-28: 3 [IU] via SUBCUTANEOUS
  Administered 2016-05-28: 2 [IU] via SUBCUTANEOUS
  Administered 2016-05-29 (×2): 3 [IU] via SUBCUTANEOUS
  Administered 2016-05-29: 2 [IU] via SUBCUTANEOUS
  Administered 2016-05-30 (×2): 3 [IU] via SUBCUTANEOUS
  Filled 2016-05-28 (×2): qty 1

## 2016-05-28 MED ORDER — FUROSEMIDE 10 MG/ML IJ SOLN
INTRAMUSCULAR | Status: AC
  Filled 2016-05-28: qty 4

## 2016-05-28 NOTE — ED Notes (Signed)
Family at bedside. 

## 2016-05-28 NOTE — ED Notes (Signed)
Pt is mildly agitated. Removing his blood pressure cuff and trying to remove IV. Prior to my arrival pt removed an IV in his left a/c. Pt's IV in right forearm is wrapped in gauze and pt not bothering at this moment. Pt is alert and oriented to person and time. Pts room is directly in front of nurses station and door will remain open.

## 2016-05-28 NOTE — ED Notes (Signed)
Pt is resting comfortably at this time.

## 2016-05-28 NOTE — Progress Notes (Signed)
PROGRESS NOTE    Raymond Navarro  [id number] DOB: 05-Sep-1928 DOA: 05/27/2016 PCP: Rusty Aus, MD   Brief Narrative: Raymond Navarro is a 82 y.o. male with medical history significant of Parkinson's disease, chronic atrial fibrillation, on chronic anticoagulation therapy, s/p PM, and DM type II; who presents after having an unwitnessed fall.  CT scan of the brain which showed the left frontal hematoma, small subarachnoid hemorrhage, and fracture to the left orbit. Dr. Sherley Bounds of Neurosurgery  and Dr. Melissa Montane of trauma surgery work, consulted while the patient was in the ED. Neurosurgery recommended follow-up CT in a.m. Trauma surgery evaluated  the patient for his facial lacerations and left orbital blowout fracture. He received stitches and was recommended to follow-up in one week for reexamination in the office.   Assessment & Plan:   Principal Problem:   Traumatic subarachnoid bleed with LOC of 30 minutes or less, initial encounter ([place]) Active Problems:   Pacemaker-[employer]   Atrial fibrillation (HCC)   Facial laceration   Fracture of left orbital floor (HCC)   Diabetes mellitus type 2 in nonobese (HCC)   Parkinson's disease ([place])   Hyperlipidemia   Hypokalemia   Small sub arachnoid hemorrhage:  - admitted to step down and repeat CT today showed  Slight increased in the posterior left frontal small hemorrhagic cortical contusion.  Slight increase in the small anterior right frontal subarachnoid Hemorrhage. No change in mental status.    Chronic atrial fibrillation:  Rate controlled.   Fracture of the left orbital floor.  Pain control.   Diabetes mellitus: CBG (last 3)   Recent Labs  05/28/16 0930 05/28/16 1158 05/28/16 1708  GLUCAP 389* 231* 173*    Resume SSI.   Hypertension: Controlled.    Leukocytosis: Reactive.  CXR and UA is negative for infection.     DVT prophylaxis: SCD'S Code Status: (Full/) Family Communication: none at  bedside.  Disposition Plan: pending further work up.    Consultants:   Neuro surgery.    Procedures: repeat CT head.   Antimicrobials: none.   Subjective: Confused.   Objective: Vitals:   05/28/16 1215 05/28/16 1230 05/28/16 1245 05/28/16 1300  BP: 127/71 125/94 115/61 131/62  Pulse: 99 96 93 96  Resp: 20 21 24 23   Temp:    99.1 F (37.3 C)  SpO2: 95% 92% 93% 91%  Weight:      Height:        Intake/Output Summary (Last 24 hours) at 05/28/16 1658 Last data filed at 05/28/16 0142  Gross per 24 hour  Intake             1150 ml  Output              250 ml  Net              900 ml   Filed Weights   05/27/16 2100  Weight: 62.6 kg (138 lb)    Examination:  General exam: Appears calm and comfortable , laceration over the left brow.  Respiratory system: Clear to auscultation. Respiratory effort normal. Cardiovascular system: S1 & S2 heard, RRR. No JVD, murmurs, rubs, gallops or clicks. No pedal edema. Gastrointestinal system: Abdomen is nondistended, soft and nontender. No organomegaly or masses felt. Normal bowel sounds heard. Central nervous system: confused.  Extremities: Symmetric 5 x 5 power. Skin: No rashes, lesions or ulcers     Data Reviewed: I have personally reviewed following labs and imaging studies  CBC:  Recent Labs Lab 05/27/16 2036 05/27/16 2048 05/28/16 0428  WBC 14.7*  --  14.9*  NEUTROABS 12.2*  --   --   HGB 14.4 15.0 13.9  HCT 42.6 44.0 42.6  MCV 88.0  --  90.4  PLT 171  --  [id number]   Basic Metabolic Panel:  Recent Labs Lab 05/27/16 2036 05/27/16 2048 05/28/16 0428  NA 132* 139 135  K 3.0* 3.3* 3.8  CL 95* 99* 99*  CO2 27  --  23  GLUCOSE 212* 210* 363*  BUN 16 19 16   CREATININE 1.02 0.80 1.10  CALCIUM 8.7*  --  8.7*   GFR: Estimated Creatinine Clearance: 43.5 mL/min (by C-G formula based on SCr of 1.1 mg/dL). Liver Function Tests:  Recent Labs Lab 05/27/16 2036  AST 29  ALT 7*  ALKPHOS 87  BILITOT 1.2  PROT 6.6    ALBUMIN 4.0   No results for input(s): LIPASE, AMYLASE in the last 168 hours. No results for input(s): AMMONIA in the last 168 hours. Coagulation Profile:  Recent Labs Lab 05/27/16 2036 05/28/16 0000 05/28/16 0428 05/28/16 1132  INR 3.00 1.28 1.25 1.28   Cardiac Enzymes: No results for input(s): CKTOTAL, CKMB, CKMBINDEX, TROPONINI in the last 168 hours. BNP (last 3 results) No results for input(s): PROBNP in the last 8760 hours. HbA1C: No results for input(s): HGBA1C in the last 72 hours. CBG:  Recent Labs Lab 05/27/16 2035 05/28/16 0822 05/28/16 0930 05/28/16 1158  GLUCAP 210* 403* 389* 231*   Lipid Profile: No results for input(s): CHOL, HDL, LDLCALC, TRIG, CHOLHDL, LDLDIRECT in the last 72 hours. Thyroid Function Tests: No results for input(s): TSH, T4TOTAL, FREET4, T3FREE, THYROIDAB in the last 72 hours. Anemia Panel: No results for input(s): VITAMINB12, FOLATE, FERRITIN, TIBC, IRON, RETICCTPCT in the last 72 hours. Sepsis Labs: No results for input(s): PROCALCITON, LATICACIDVEN in the last 168 hours.  No results found for this or any previous visit (from the past 240 hour(s)).       Radiology Studies: Ct Head Wo Contrast  Result Date: 05/28/2016 CLINICAL DATA:  Recent fall, loss of consciousness, acute subarachnoid hemorrhage 05/27/2016 EXAM: CT HEAD WITHOUT CONTRAST TECHNIQUE: Contiguous axial images were obtained from the base of the skull through the vertex without intravenous contrast. COMPARISON:  05/27/2016 FINDINGS: Brain: Minimal increase in the small left posterior frontal hyperdense hemorrhagic contusion, image 22. , but best appreciated on the coronal reconstructions. Slight increase in the small amount of anterior right frontal all subarachnoid hemorrhage, image 23. Stable brain atrophy and extensive chronic white matter microvascular ischemic changes throughout both cerebral hemispheres. No significant mass effect, focal edema, midline shift,  herniation, hydrocephalus, or extra-axial fluid collection. No interventricular hemorrhage. Cisterns remain patent. Cerebellar atrophy as well. Vascular: No hyperdense vessel or unexpected calcification. Skull: Negative for skull fracture. Mastoids remain clear. Left frontal orbital soft tissue hematoma again noted. Sinuses/Orbits: Orbits are intact and symmetric. Air-fluid level in the left maxillary sinus as before. Other sinuses remain clear. Other: None. IMPRESSION: Slight increased in the posterior left frontal small hemorrhagic cortical contusion. Slight increase in the small anterior right frontal subarachnoid hemorrhage Stable atrophy and chronic white matter microvascular changes Stable left frontal orbital/ preseptal soft tissue hematoma. Electronically Signed   By: Jerilynn Mages.  Shick M.D.   On: 05/28/2016 09:11   Ct Cervical Spine Wo Contrast  Result Date: 05/27/2016 CLINICAL DATA:  Patient found down today. Hematoma above the left eye. Initial encounter. EXAM: CT CERVICAL SPINE WITHOUT CONTRAST TECHNIQUE: Multidetector  CT imaging of the cervical spine was performed without intravenous contrast. Multiplanar CT image reconstructions were also generated. COMPARISON:  None. FINDINGS: Alignment: Trace anterolisthesis C7 on T1 due to facet degenerative disease is noted. Alignment is otherwise maintained. Skull base and vertebrae: No acute fracture. No primary bone lesion or focal pathologic process. Soft tissues and spinal canal: No prevertebral fluid or swelling. No visible canal hematoma. Disc levels: Mild loss of disc space height and endplate spurring are seen at C6-7. Multilevel facet arthropathy is seen. No obvious central canal or foraminal stenosis. Upper chest: Lung apices are clear. Other: None. IMPRESSION: No acute abnormality.  Mild degenerative disease noted. Electronically Signed   By: Inge Rise M.D.   On: 05/27/2016 21:12   Ct Pelvis Wo Contrast  Result Date: 05/28/2016 CLINICAL DATA:   Unwitnessed fall tonight.  Left hip pain. EXAM: CT PELVIS WITHOUT CONTRAST TECHNIQUE: Multidetector CT imaging of the pelvis was performed following the standard protocol without intravenous contrast. COMPARISON:  Radiographs 05/27/2016 FINDINGS: There are acute minimally displaced fractures of the inferior and superior left pubic rami. There is a nondisplaced left sacral ala fracture. Pubic symphysis is intact. Sacroiliac joints are intact. Left hip is intact. Right hip arthroplasties intact. There is a small extraperitoneal hemorrhage in the pelvis, associated with the pubic ramus fractures. Incidental findings include colonic diverticulosis and moderate atherosclerotic calcification of the iliac arteries. IMPRESSION: Acute fractures of the left pubic rami and left sacrum, nondisplaced to minimally displaced. Small extraperitoneal hemorrhage in the low left hemipelvis associated with the pubic ramus fractures. Hips are intact. Electronically Signed   By: Andreas Newport M.D.   On: 05/28/2016 01:55   Dg Pelvis Portable  Result Date: 05/27/2016 CLINICAL DATA:  Fall and pain.  Initial encounter. EXAM: PORTABLE PELVIS 1-2 VIEWS COMPARISON:  01/22/2013 FINDINGS: AP view of the pelvis. Right hip arthroplasty. No acute complication. Sacroiliac joints are symmetric. IMPRESSION: No acute osseous abnormality. Electronically Signed   By: Abigail Miyamoto M.D.   On: 05/27/2016 22:40   Dg Chest Port 1 View  Result Date: 05/28/2016 CLINICAL DATA:  82 year old male with fall and shortness of breath. EXAM: PORTABLE CHEST 1 VIEW COMPARISON:  Chest radiograph dated 05/27/2016 FINDINGS: There is eventration of the right hemidiaphragm. The lungs are clear. There is no pleural effusion or pneumothorax. The cardiac silhouette is within normal limits. The aorta is tortuous. There is atherosclerotic calcification of the aorta. Left pectoral pacemaker device. No acute osseous pathology. IMPRESSION: No acute cardiopulmonary  process. Electronically Signed   By: Anner Crete M.D.   On: 05/28/2016 04:23   Dg Chest Portable 1 View  Result Date: 05/27/2016 CLINICAL DATA:  Status post fall with rib and head pain. EXAM: PORTABLE CHEST 1 VIEW COMPARISON:  Chest radiograph 07/13/2012 FINDINGS: Single lead left chest wall pacemaker lead overlies the right ventricle. There is atherosclerotic calcification within the aortic arch. Cardiomediastinal size is otherwise normal. There are scattered bilateral interstitial opacities without overt pulmonary edema. No focal airspace consolidation. No pleural effusion or pneumothorax. No acute osseous abnormality. IMPRESSION: 1. No focal airspace consolidation. 2. Aortic atherosclerosis. 3. No rib fracture. Electronically Signed   By: Ulyses Jarred M.D.   On: 05/27/2016 22:43   Ct Head Code Stroke W/o Cm  Result Date: 05/27/2016 CLINICAL DATA:  Code stroke. 82 year old male found down. Diabetes. Parkinson's disease. Skin cancer. Initial encounter. EXAM: CT HEAD WITHOUT CONTRAST TECHNIQUE: Contiguous axial images were obtained from the base of the skull through the vertex without  intravenous contrast. COMPARISON:  01/20/2011 MR.  06/15/2010 CT. FINDINGS: Brain: Posterior left frontal lobe small hemorrhagic contusion measuring up to 8 mm. Small amount of subarachnoid blood anterior right frontal region. Prominent chronic microvascular changes without CT evidence of large acute infarct. Global atrophy without hydrocephalus. No intracranial mass lesion noted on this unenhanced exam. Vascular: No hyperdense vessel.  Vascular calcifications. Skull: No skull fracture Sinuses/Orbits: Left preseptal moderately large hematoma. The underlying globe appears to be grossly intact however there is a left orbital floor fracture with slight displacement. No entrapment of the inferior rectus muscle. Other: Negative ASPECTS ([place] Stroke Program Early CT Score) - Ganglionic level infarction (caudate, lentiform  nuclei, internal capsule, insula, M1-M3 cortex): 7 - Supraganglionic infarction (M4-M6 cortex): 3 Total score (0-10 with 10 being normal): 10 IMPRESSION: 1. Posterior left frontal lobe small hemorrhagic contusion measuring up to 8 mm. Small amount of subarachnoid blood anterior right frontal region. Prominent chronic microvascular changes without CT evidence of large acute infarct. Global atrophy without hydrocephalus. Left preseptal moderately large hematoma. The underlying globe appears to be grossly intact however there is a left orbital floor fracture with slight displacement. No entrapment of the inferior rectus muscle. 1. ASPECTS is 10 These results were called by telephone at the time of interpretation on 05/27/2016 at 8:54 Pm to Dr. Elnora Morrison , who verbally acknowledged these results. Upon initially calling Dr. Nicole Kindred, code stroke was canceled. Electronically Signed   By: Genia Del M.D.   On: 05/27/2016 21:07   Ct Maxillofacial Wo Contrast  Result Date: 05/27/2016 CLINICAL DATA:  Recent fall EXAM: CT MAXILLOFACIAL WITHOUT CONTRAST TECHNIQUE: Multidetector CT imaging of the maxillofacial structures was performed. Multiplanar CT image reconstructions were also generated. A small metallic BB was placed on the right temple in order to reliably differentiate right from left. COMPARISON:  None. FINDINGS: Osseous: Degenerative changes of the cervical spine are noted. Mild blowout fracture of the inferior orbital wall on the left is noted. No muscular entrapment is noted although the inferior rectus muscle lies adjacent to the bony defect. Orbits: Postsurgical changes are noted in both globes. Sinuses: Mild mucosal changes are noted in the left maxillary antrum consistent with the adjacent blowout fracture. Soft tissues: Considerable soft tissue swelling is noted in the left periorbital region. No other soft tissue abnormality is noted. Limited intracranial: Atrophic changes are noted. IMPRESSION: Left  inferior orbital wall blowout fracture with only mild displacement of the inferior fracture fragment. The left inferior rectus approaches the bony defect although does not appear entrapped. The muscle is somewhat edematous. Significant left periorbital soft tissue swelling. No other focal abnormality is noted. Electronically Signed   By: Inez Catalina M.D.   On: 05/27/2016 21:22        Scheduled Meds: . brimonidine  2 drop Both Eyes BID  . carbidopa-levodopa  1 tablet Oral TID WC  . diltiazem  180 mg Oral Daily  . insulin aspart  0-5 Units Subcutaneous QHS  . insulin aspart  0-9 Units Subcutaneous TID WC  . pravastatin  20 mg Oral QHS   Continuous Infusions: . sodium chloride 75 mL/hr at 05/28/16 0922     LOS: 0 days    Time spent: 52 minutes    Sherina Stammer, MD [facility] Pager [phone] If 7PM-7AM, please contact night-coverage [email or web address] Password [facility] 05/28/2016, 4:58 PM

## 2016-05-28 NOTE — H&P (Signed)
History and Physical    NYEL BACAK [id number] DOB: 1928/09/13 DOA: 05/27/2016  Referring MD/NP/PA: Dr. Reather Converse PCP: Rusty Aus, MD  Patient coming from: Home  Chief Complaint: Fall  HPI: Raymond Navarro is a 82 y.o. male with medical history significant of Parkinson's disease, chronic atrial fibrillation, on chronic anticoagulation therapy, s/p PM, and DM type II; who presents after having an unwitnessed fall. Family noting that the patient falls frequently around the home. Family was out shopping at the time were unable to get a hold of him by phone and therefore returned home. They found him on the ground by the table with blood all over his face. Patient has no memory of the fall. Complains of headache, pain of his neck, and because vision out of the left eye. Patient denies any shortness of breath, chest pain, nausea, vomiting, or abdominal pain.  ED Course: Upon admission so was evaluated with CT scan of the brain which showed the left frontal hematoma, small subarachnoid hemorrhage, and fracture to the left orbit. Patient's INR was done. 3. Patient was given 10 mg of vitamin K and  Kcentra. Dr. Sherley Bounds of Neurosurgery  and Dr. Melissa Montane of trauma surgery work, consulted while the patient was in the ED. Neurosurgery recommended follow-up CT in a.m. Trauma surgery evaluated  the patient for his facial lacerations and left orbital blowout fracture. He received stitches and was recommended to follow-up in one week for reexamination in the office.   Review of Systems: Difficult to interpret due to  Past Medical History:  Diagnosis Date  . Anemia   . Atrial fibrillation, chronic ([place])   . Cancer (HCC)    hx of skin cancer  . Colitis    Hx of  . Diabetes mellitus    type 2  . Dysrhythmia    ATRIAL FIBRILATION  . History of chicken pox   . Hyperlipidemia   . Hypertension   . Incontinence of feces   . Parkinson disease ([place])   . Presence of permanent cardiac pacemaker  01/11/12   [employer] [place] Single chamber, Serial # [id number]  . Vitamin D deficiency   . Wears hearing aid    bilateral    Past Surgical History:  Procedure Laterality Date  . APPENDECTOMY    . CHOLECYSTECTOMY  1972  . COLONOSCOPY WITH PROPOFOL N/A 12/02/2015   Procedure: COLONOSCOPY WITH PROPOFOL;  Surgeon: Lollie Sails, MD;  Location: [facility] ENDOSCOPY;  Service: Endoscopy;  Laterality: N/A;  . COLONOSCOPY WITH PROPOFOL N/A 12/06/2015   Procedure: COLONOSCOPY WITH PROPOFOL;  Surgeon: Lollie Sails, MD;  Location: [facility] ENDOSCOPY;  Service: Endoscopy;  Laterality: N/A;  . FRACTURE SURGERY    . hip surgery    . PERMANENT PACEMAKER INSERTION N/A 01/11/2012   Procedure: PERMANENT PACEMAKER INSERTION;  Surgeon: Evans Lance, MD;  Location[facility] CATH LAB;  Service: Cardiovascular;  Laterality: N/A;  . TONSILLECTOMY  1972  . VASECTOMY  1980     reports that he has never smoked. He has never used smokeless tobacco. He reports that he does not drink alcohol or use drugs.  Allergies  Allergen Reactions  . Niacin And Related Itching and Rash    Family History  Problem Relation Age of Onset  . Family history unknown: Yes    Prior to Admission medications   Medication Sig Start Date End Date Taking? Authorizing Provider  acetaminophen (TYLENOL) 500 MG tablet Take 1,000 mg by mouth every 6 (six) hours  as needed (pain).   Yes Historical Provider, MD  alfuzosin (UROXATRAL) 10 MG 24 hr tablet Take 10 mg by mouth daily. 05/20/16  Yes Historical Provider, MD  amoxicillin (AMOXIL) 500 MG capsule Take 2,000 mg by mouth See admin instructions. Take 4 capsules (2000 mg) by mouth one hour before dental appointments   Yes Historical Provider, MD  b complex vitamins tablet Take 1 tablet by mouth daily.   Yes Historical Provider, MD  brimonidine (ALPHAGAN) 0.2 % ophthalmic solution Place 2 drops into both eyes 2 (two) times daily. 05/20/16  Yes Historical Provider, MD  carbidopa-levodopa (SINEMET  IR) 25-100 MG tablet Take 1 tablet by mouth 3 (three) times daily.   Yes Historical Provider, MD  Cholecalciferol (VITAMIN D PO) Take 1 tablet by mouth daily.   Yes Historical Provider, MD  diltiazem (CARDIZEM CD) 180 MG 24 hr capsule Take 180 mg by mouth daily. 05/20/16  Yes Historical Provider, MD  glimepiride (AMARYL) 4 MG tablet Take 4 mg by mouth 2 (two) times daily.    Yes Historical Provider, MD  metFORMIN (GLUCOPHAGE-XR) 500 MG 24 hr tablet Take 500 mg by mouth 2 (two) times daily.   Yes Historical Provider, MD  pravastatin (PRAVACHOL) 20 MG tablet Take 20 mg by mouth at bedtime.    Yes Historical Provider, MD  sertraline (ZOLOFT) 50 MG tablet Take 50 mg by mouth daily. 05/22/16  Yes Historical Provider, MD  sitaGLIPtin (JANUVIA) 50 MG tablet Take 50 mg by mouth daily.   Yes Historical Provider, MD  warfarin (COUMADIN) 5 MG tablet Take 5 mg by mouth daily.    Yes Historical Provider, MD    Physical Exam:   Constitutional:Elderly male who appears well-developed Vitals:   05/27/16 2245 05/27/16 2300 05/27/16 2315 05/28/16 0015  BP: 173/91 154/85 158/86 177/88  Pulse: 97 96 93 93  Resp: 22 22 21 23   SpO2: 94% 90% 96% 100%  Weight:      Height:       Eyes: Periorbital edema noted surrounding the left eye with laceration above the brow. Right eye pupil reactive to light. ENMT: Mucous membranes are dry. Posterior pharynx clear of any exudate or lesions..  Neck: normal, supple, no masses, no thyromegaly Respiratory: clear to auscultation bilaterally, no wheezing, no crackles. Normal respiratory effort. No accessory muscle use.  Cardiovascular: Irregular irregular, no murmurs / rubs / gallops. No extremity edema. 2+ pedal pulses. No carotid bruits.  Abdomen: no tenderness, no masses palpated. No hepatosplenomegaly. Bowel sounds positive.  Musculoskeletal: no clubbing / cyanosis. No joint deformity upper and lower extremities. Decreased range of motion of left lower extremity. Tenderness  to palpation of the cervical spine as well as left hip. Skin: no rashes, lesions, ulcers. No induration Neurologic: CN 2-12 grossly intact. Sensation intact, patient was baseline tremor. Psychiatric: Patient appears to be confused. Alert and oriented to person and place.    Labs on Admission: I have personally reviewed following labs and imaging studies  CBC:  Recent Labs Lab 05/27/16 2036 05/27/16 2048  WBC 14.7*  --   NEUTROABS 12.2*  --   HGB 14.4 15.0  HCT 42.6 44.0  MCV 88.0  --   PLT 171  --    Basic Metabolic Panel:  Recent Labs Lab 05/27/16 2036 05/27/16 2048  NA 132* 139  K 3.0* 3.3*  CL 95* 99*  CO2 27  --   GLUCOSE 212* 210*  BUN 16 19  CREATININE 1.02 0.80  CALCIUM 8.7*  --  GFR: Estimated Creatinine Clearance: 59.8 mL/min (by C-G formula based on SCr of 0.8 mg/dL). Liver Function Tests:  Recent Labs Lab 05/27/16 2036  AST 29  ALT 7*  ALKPHOS 87  BILITOT 1.2  PROT 6.6  ALBUMIN 4.0   No results for input(s): LIPASE, AMYLASE in the last 168 hours. No results for input(s): AMMONIA in the last 168 hours. Coagulation Profile:  Recent Labs Lab 05/27/16 2036 05/28/16 0000  INR 3.00 1.28   Cardiac Enzymes: No results for input(s): CKTOTAL, CKMB, CKMBINDEX, TROPONINI in the last 168 hours. BNP (last 3 results) No results for input(s): PROBNP in the last 8760 hours. HbA1C: No results for input(s): HGBA1C in the last 72 hours. CBG:  Recent Labs Lab 05/27/16 2035  GLUCAP 210*   Lipid Profile: No results for input(s): CHOL, HDL, LDLCALC, TRIG, CHOLHDL, LDLDIRECT in the last 72 hours. Thyroid Function Tests: No results for input(s): TSH, T4TOTAL, FREET4, T3FREE, THYROIDAB in the last 72 hours. Anemia Panel: No results for input(s): VITAMINB12, FOLATE, FERRITIN, TIBC, IRON, RETICCTPCT in the last 72 hours. Urine analysis:    Component Value Date/Time   COLORURINE YELLOW 05/27/2016 2322   APPEARANCEUR CLEAR 05/27/2016 2322    APPEARANCEUR Hazy 01/13/2013 1000   LABSPEC 1.013 05/27/2016 2322   LABSPEC 1.023 01/13/2013 1000   PHURINE 7.0 05/27/2016 2322   GLUCOSEU 150 (A) 05/27/2016 2322   GLUCOSEU Negative 01/13/2013 1000   HGBUR SMALL (A) 05/27/2016 2322   BILIRUBINUR NEGATIVE 05/27/2016 2322   BILIRUBINUR Negative 01/13/2013 1000   KETONESUR 20 (A) 05/27/2016 2322   PROTEINUR 100 (A) 05/27/2016 2322   NITRITE NEGATIVE 05/27/2016 2322   LEUKOCYTESUR NEGATIVE 05/27/2016 2322   LEUKOCYTESUR Negative 01/13/2013 1000   Sepsis Labs: No results found for this or any previous visit (from the past 240 hour(s)).   Radiological Exams on Admission: Ct Cervical Spine Wo Contrast  Result Date: 05/27/2016 CLINICAL DATA:  Patient found down today. Hematoma above the left eye. Initial encounter. EXAM: CT CERVICAL SPINE WITHOUT CONTRAST TECHNIQUE: Multidetector CT imaging of the cervical spine was performed without intravenous contrast. Multiplanar CT image reconstructions were also generated. COMPARISON:  None. FINDINGS: Alignment: Trace anterolisthesis C7 on T1 due to facet degenerative disease is noted. Alignment is otherwise maintained. Skull base and vertebrae: No acute fracture. No primary bone lesion or focal pathologic process. Soft tissues and spinal canal: No prevertebral fluid or swelling. No visible canal hematoma. Disc levels: Mild loss of disc space height and endplate spurring are seen at C6-7. Multilevel facet arthropathy is seen. No obvious central canal or foraminal stenosis. Upper chest: Lung apices are clear. Other: None. IMPRESSION: No acute abnormality.  Mild degenerative disease noted. Electronically Signed   By: Inge Rise M.D.   On: 05/27/2016 21:12   Dg Pelvis Portable  Result Date: 05/27/2016 CLINICAL DATA:  Fall and pain.  Initial encounter. EXAM: PORTABLE PELVIS 1-2 VIEWS COMPARISON:  01/22/2013 FINDINGS: AP view of the pelvis. Right hip arthroplasty. No acute complication. Sacroiliac joints  are symmetric. IMPRESSION: No acute osseous abnormality. Electronically Signed   By: Abigail Miyamoto M.D.   On: 05/27/2016 22:40   Dg Chest Portable 1 View  Result Date: 05/27/2016 CLINICAL DATA:  Status post fall with rib and head pain. EXAM: PORTABLE CHEST 1 VIEW COMPARISON:  Chest radiograph 07/13/2012 FINDINGS: Single lead left chest wall pacemaker lead overlies the right ventricle. There is atherosclerotic calcification within the aortic arch. Cardiomediastinal size is otherwise normal. There are scattered bilateral interstitial opacities without  overt pulmonary edema. No focal airspace consolidation. No pleural effusion or pneumothorax. No acute osseous abnormality. IMPRESSION: 1. No focal airspace consolidation. 2. Aortic atherosclerosis. 3. No rib fracture. Electronically Signed   By: Ulyses Jarred M.D.   On: 05/27/2016 22:43   Ct Head Code Stroke W/o Cm  Result Date: 05/27/2016 CLINICAL DATA:  Code stroke. 82 year old male found down. Diabetes. Parkinson's disease. Skin cancer. Initial encounter. EXAM: CT HEAD WITHOUT CONTRAST TECHNIQUE: Contiguous axial images were obtained from the base of the skull through the vertex without intravenous contrast. COMPARISON:  01/20/2011 MR.  06/15/2010 CT. FINDINGS: Brain: Posterior left frontal lobe small hemorrhagic contusion measuring up to 8 mm. Small amount of subarachnoid blood anterior right frontal region. Prominent chronic microvascular changes without CT evidence of large acute infarct. Global atrophy without hydrocephalus. No intracranial mass lesion noted on this unenhanced exam. Vascular: No hyperdense vessel.  Vascular calcifications. Skull: No skull fracture Sinuses/Orbits: Left preseptal moderately large hematoma. The underlying globe appears to be grossly intact however there is a left orbital floor fracture with slight displacement. No entrapment of the inferior rectus muscle. Other: Negative ASPECTS ([address] Stroke Program Early CT Score) -  Ganglionic level infarction (caudate, lentiform nuclei, internal capsule, insula, M1-M3 cortex): 7 - Supraganglionic infarction (M4-M6 cortex): 3 Total score (0-10 with 10 being normal): 10 IMPRESSION: 1. Posterior left frontal lobe small hemorrhagic contusion measuring up to 8 mm. Small amount of subarachnoid blood anterior right frontal region. Prominent chronic microvascular changes without CT evidence of large acute infarct. Global atrophy without hydrocephalus. Left preseptal moderately large hematoma. The underlying globe appears to be grossly intact however there is a left orbital floor fracture with slight displacement. No entrapment of the inferior rectus muscle. 1. ASPECTS is 10 These results were called by telephone at the time of interpretation on 05/27/2016 at 8:54 Pm to Dr. Elnora Morrison , who verbally acknowledged these results. Upon initially calling Dr. Nicole Kindred, code stroke was canceled. Electronically Signed   By: Genia Del M.D.   On: 05/27/2016 21:07   Ct Maxillofacial Wo Contrast  Result Date: 05/27/2016 CLINICAL DATA:  Recent fall EXAM: CT MAXILLOFACIAL WITHOUT CONTRAST TECHNIQUE: Multidetector CT imaging of the maxillofacial structures was performed. Multiplanar CT image reconstructions were also generated. A small metallic BB was placed on the right temple in order to reliably differentiate right from left. COMPARISON:  None. FINDINGS: Osseous: Degenerative changes of the cervical spine are noted. Mild blowout fracture of the inferior orbital wall on the left is noted. No muscular entrapment is noted although the inferior rectus muscle lies adjacent to the bony defect. Orbits: Postsurgical changes are noted in both globes. Sinuses: Mild mucosal changes are noted in the left maxillary antrum consistent with the adjacent blowout fracture. Soft tissues: Considerable soft tissue swelling is noted in the left periorbital region. No other soft tissue abnormality is noted. Limited  intracranial: Atrophic changes are noted. IMPRESSION: Left inferior orbital wall blowout fracture with only mild displacement of the inferior fracture fragment. The left inferior rectus approaches the bony defect although does not appear entrapped. The muscle is somewhat edematous. Significant left periorbital soft tissue swelling. No other focal abnormality is noted. Electronically Signed   By: Inez Catalina M.D.   On: 05/27/2016 21:22    EKG: Independently reviewed. Atrial fibrillation  Assessment/Plan Traumatic subarachnoid bleed with LOC of 30 minutes or less, initial encounter: Acute. Secondary to unwitnessed fall  - Admit to step down - Neuro check - Recheck CT scan in  a.m. per neurosurgery  -Follow with neurosurgery for further recommendations   Facial laceration with left orbital blowout fracture: Acute. Following fall patient noted to have large frontal hematoma. Seen by Dr.  Atrial fibrillation on chronic anticoagulation: Patient's initial INR was noted to be 3 anticoagulation was reversed with 10 mg of Vit K and Kcentra. - Continue oral anticoagulant reversal protocol - Hold Warfarin secondary to bleed - Continue diltiazem  Diabetes mellitus type 2 - Hypoglycemic protocols - Hold metformin, glimepiride, and Januvia - CBGs every before meals and at bedtime with sensitive sliding scale insulin for now.  Parkinson's disease - continue Sinemet IR  Hyperlipidemia - Continue pravastatin  Depression - Continue Zoloft  Status post pacemaker  Hypokalemia: Patient's initial potassium was noted to be 3. Patient was given 40 mEq of potassium chloride. - Will continue to monitor replace as needed   DVT prophylaxis: SCD  Code Status: Full Family Communication: No family present at bedside  Disposition Plan: To be determined Consults called: Neurosurgery and trauma surgery Admission status: Observation  Norval Morton MD [facility] Pager [phone]  If  7PM-7AM, please contact night-coverage [email or web address] Password [email or web address]  05/28/2016, 1:07 AM

## 2016-05-28 NOTE — ED Notes (Signed)
This RN cleaned dry blood from the back side of pts head and face.

## 2016-05-28 NOTE — ED Notes (Addendum)
Spoke with house coverage concerning a possible step down bed for pt. States they are working to hopefully have a bed in the near future. Wifes phone number [phone] will call and update when pt has bed. Pt is resting in bed at this time with eyes closed. Door remains open. Lights dimmed.

## 2016-05-28 NOTE — ED Notes (Signed)
Raymond Jonatan, MD Notified of patients condition. Orders received and he stated he will re-eval

## 2016-05-28 NOTE — ED Notes (Signed)
Pt transported to repeat head CT.

## 2016-05-29 DIAGNOSIS — Z7901 Long term (current) use of anticoagulants: Secondary | ICD-10-CM | POA: Diagnosis not present

## 2016-05-29 DIAGNOSIS — R296 Repeated falls: Secondary | ICD-10-CM | POA: Diagnosis present

## 2016-05-29 DIAGNOSIS — S0232XA Fracture of orbital floor, left side, initial encounter for closed fracture: Secondary | ICD-10-CM | POA: Diagnosis present

## 2016-05-29 DIAGNOSIS — S066X1A Traumatic subarachnoid hemorrhage with loss of consciousness of 30 minutes or less, initial encounter: Secondary | ICD-10-CM | POA: Diagnosis not present

## 2016-05-29 DIAGNOSIS — I10 Essential (primary) hypertension: Secondary | ICD-10-CM | POA: Diagnosis present

## 2016-05-29 DIAGNOSIS — S32592A Other specified fracture of left pubis, initial encounter for closed fracture: Secondary | ICD-10-CM | POA: Diagnosis present

## 2016-05-29 DIAGNOSIS — S01511A Laceration without foreign body of lip, initial encounter: Secondary | ICD-10-CM | POA: Diagnosis present

## 2016-05-29 DIAGNOSIS — E119 Type 2 diabetes mellitus without complications: Secondary | ICD-10-CM | POA: Diagnosis not present

## 2016-05-29 DIAGNOSIS — Z79899 Other long term (current) drug therapy: Secondary | ICD-10-CM | POA: Diagnosis not present

## 2016-05-29 DIAGNOSIS — Z7984 Long term (current) use of oral hypoglycemic drugs: Secondary | ICD-10-CM | POA: Diagnosis not present

## 2016-05-29 DIAGNOSIS — W19XXXA Unspecified fall, initial encounter: Secondary | ICD-10-CM | POA: Diagnosis present

## 2016-05-29 DIAGNOSIS — D72829 Elevated white blood cell count, unspecified: Secondary | ICD-10-CM | POA: Diagnosis present

## 2016-05-29 DIAGNOSIS — S01112A Laceration without foreign body of left eyelid and periocular area, initial encounter: Secondary | ICD-10-CM | POA: Diagnosis present

## 2016-05-29 DIAGNOSIS — Z66 Do not resuscitate: Secondary | ICD-10-CM | POA: Diagnosis present

## 2016-05-29 DIAGNOSIS — Z950 Presence of cardiac pacemaker: Secondary | ICD-10-CM | POA: Diagnosis not present

## 2016-05-29 DIAGNOSIS — S0181XD Laceration without foreign body of other part of head, subsequent encounter: Secondary | ICD-10-CM

## 2016-05-29 DIAGNOSIS — E785 Hyperlipidemia, unspecified: Secondary | ICD-10-CM | POA: Diagnosis present

## 2016-05-29 DIAGNOSIS — I482 Chronic atrial fibrillation: Secondary | ICD-10-CM | POA: Diagnosis not present

## 2016-05-29 DIAGNOSIS — S32110A Nondisplaced Zone I fracture of sacrum, initial encounter for closed fracture: Secondary | ICD-10-CM | POA: Diagnosis present

## 2016-05-29 DIAGNOSIS — F329 Major depressive disorder, single episode, unspecified: Secondary | ICD-10-CM | POA: Diagnosis present

## 2016-05-29 DIAGNOSIS — Z888 Allergy status to other drugs, medicaments and biological substances status: Secondary | ICD-10-CM | POA: Diagnosis not present

## 2016-05-29 DIAGNOSIS — Y92009 Unspecified place in unspecified non-institutional (private) residence as the place of occurrence of the external cause: Secondary | ICD-10-CM | POA: Diagnosis not present

## 2016-05-29 DIAGNOSIS — Z85828 Personal history of other malignant neoplasm of skin: Secondary | ICD-10-CM | POA: Diagnosis not present

## 2016-05-29 DIAGNOSIS — Z974 Presence of external hearing-aid: Secondary | ICD-10-CM | POA: Diagnosis not present

## 2016-05-29 DIAGNOSIS — E876 Hypokalemia: Secondary | ICD-10-CM | POA: Diagnosis present

## 2016-05-29 DIAGNOSIS — R4182 Altered mental status, unspecified: Secondary | ICD-10-CM | POA: Diagnosis present

## 2016-05-29 DIAGNOSIS — G20 Parkinson's disease: Secondary | ICD-10-CM | POA: Diagnosis present

## 2016-05-29 LAB — GLUCOSE, CAPILLARY
Glucose-Capillary: 220 mg/dL — ABNORMAL HIGH (ref 65–99)
Glucose-Capillary: 188 mg/dL — ABNORMAL HIGH (ref 65–99)
Glucose-Capillary: 233 mg/dL — ABNORMAL HIGH (ref 65–99)
Glucose-Capillary: 167 mg/dL — ABNORMAL HIGH (ref 65–99)

## 2016-05-29 LAB — PROTIME-INR
INR: 1.26
Prothrombin Time: 15.8 seconds — ABNORMAL HIGH (ref 11.4–15.2)

## 2016-05-29 NOTE — Progress Notes (Signed)
PROGRESS NOTE    Raymond Navarro  [id number] DOB: 04-04-1929 DOA: 05/27/2016 PCP: Rusty Aus, MD   Brief Narrative: Raymond Navarro is a 82 y.o. male with medical history significant of Parkinson's disease, chronic atrial fibrillation, on chronic anticoagulation therapy, s/p PM, and DM type II; who presents after having an unwitnessed fall.  CT scan of the brain which showed the left frontal hematoma, small subarachnoid hemorrhage, and fracture to the left orbit. Dr. Sherley Bounds of Neurosurgery  and Dr. Melissa Montane of trauma surgery work, consulted while the patient was in the ED. Neurosurgery recommended follow-up CT in a.m. Trauma surgery evaluated  the patient for his facial lacerations and left orbital blowout fracture. He received stitches and was recommended to follow-up in one week for reexamination in the office.   Assessment & Plan:   Principal Problem:   Traumatic subarachnoid bleed with LOC of 30 minutes or less, initial encounter ([place]) Active Problems:   Pacemaker-[employer]   Atrial fibrillation (HCC)   Facial laceration   Fracture of left orbital floor (HCC)   Diabetes mellitus type 2 in nonobese (HCC)   Parkinson's disease ([place])   Hyperlipidemia   Hypokalemia   Small sub arachnoid hemorrhage:  - admitted to step down and repeat CT  showed  Slight increased in the posterior left frontal small hemorrhagic cortical contusion. Currently he is alert and oriented and no new complaints.   Slight increase in the small anterior right frontal subarachnoid Hemorrhage. No change in mental status.    Chronic atrial fibrillation:  Rate controlled. Off anticoagulation due to sub arachnoid hemorrhage.   Fracture of the left orbital floor.  Pain control.   Diabetes mellitus: CBG (last 3)   Recent Labs  05/29/16 0819 05/29/16 1127 05/29/16 1615  GLUCAP 220* 188* 233*    Resume SSI.   Hypertension: Controlled.    Leukocytosis: Reactive.  CXR and UA is  negative for infection.  Repeat CBC in am.     DVT prophylaxis: SCD'S Code Status: (Full/) Family Communication: none at bedside.  Disposition Plan: pending further work up.    Consultants:   Neuro surgery.    Procedures: repeat CT head.   Antimicrobials: none.   Subjective: Alert and denies any new complaints.   Objective: Vitals:   05/29/16 0335 05/29/16 0820 05/29/16 1141 05/29/16 1402  BP: (!) 162/74 (!) 164/79 129/70 (!) 163/78  Pulse: 88 97 92 89  Resp: (!) 23 (!) 27 (!) 27 (!) 26  Temp: 99.2 F (37.3 C) 98.7 F (37.1 C) 98.8 F (37.1 C) 98 F (36.7 C)  TempSrc: Oral Oral Oral Oral  SpO2: 91% 96% 95% 97%  Weight:      Height:        Intake/Output Summary (Last 24 hours) at 05/29/16 1650 Last data filed at 05/29/16 1000  Gross per 24 hour  Intake           1667.5 ml  Output             1250 ml  Net            417.5 ml   Filed Weights   05/27/16 2100  Weight: 62.6 kg (138 lb)    Examination:  General exam: Appears calm and comfortable , laceration over the left brow.  Respiratory system: Clear to auscultation. Respiratory effort normal. Cardiovascular system: S1 & S2 heard, RRR. No JVD, murmurs, rubs, gallops or clicks. No pedal edema. Gastrointestinal system: Abdomen is nondistended, soft and  nontender. No organomegaly or masses felt. Normal bowel sounds heard. Central nervous system: alert and comfortable.  Extremities: Symmetric 5 x 5 power. Skin: No rashes, lesions or ulcers     Data Reviewed: I have personally reviewed following labs and imaging studies  CBC:  Recent Labs Lab 05/27/16 2036 05/27/16 2048 05/28/16 0428  WBC 14.7*  --  14.9*  NEUTROABS 12.2*  --   --   HGB 14.4 15.0 13.9  HCT 42.6 44.0 42.6  MCV 88.0  --  90.4  PLT 171  --  [id number]   Basic Metabolic Panel:  Recent Labs Lab 05/27/16 2036 05/27/16 2048 05/28/16 0428  NA 132* 139 135  K 3.0* 3.3* 3.8  CL 95* 99* 99*  CO2 27  --  23  GLUCOSE 212* 210* 363*    BUN 16 19 16   CREATININE 1.02 0.80 1.10  CALCIUM 8.7*  --  8.7*   GFR: Estimated Creatinine Clearance: 43.5 mL/min (by C-G formula based on SCr of 1.1 mg/dL). Liver Function Tests:  Recent Labs Lab 05/27/16 2036  AST 29  ALT 7*  ALKPHOS 87  BILITOT 1.2  PROT 6.6  ALBUMIN 4.0   No results for input(s): LIPASE, AMYLASE in the last 168 hours. No results for input(s): AMMONIA in the last 168 hours. Coagulation Profile:  Recent Labs Lab 05/28/16 0000 05/28/16 0428 05/28/16 1132 05/28/16 1613 05/29/16 0557  INR 1.28 1.25 1.28 1.26 1.26   Cardiac Enzymes: No results for input(s): CKTOTAL, CKMB, CKMBINDEX, TROPONINI in the last 168 hours. BNP (last 3 results) No results for input(s): PROBNP in the last 8760 hours. HbA1C: No results for input(s): HGBA1C in the last 72 hours. CBG:  Recent Labs Lab 05/28/16 1708 05/28/16 2252 05/29/16 0819 05/29/16 1127 05/29/16 1615  GLUCAP 173* 166* 220* 188* 233*   Lipid Profile: No results for input(s): CHOL, HDL, LDLCALC, TRIG, CHOLHDL, LDLDIRECT in the last 72 hours. Thyroid Function Tests: No results for input(s): TSH, T4TOTAL, FREET4, T3FREE, THYROIDAB in the last 72 hours. Anemia Panel: No results for input(s): VITAMINB12, FOLATE, FERRITIN, TIBC, IRON, RETICCTPCT in the last 72 hours. Sepsis Labs: No results for input(s): PROCALCITON, LATICACIDVEN in the last 168 hours.  Recent Results (from the past 240 hour(s))  MRSA PCR Screening     Status: None   Collection Time: 05/28/16  4:31 PM  Result Value Ref Range Status   MRSA by PCR NEGATIVE NEGATIVE Final    Comment:        The GeneXpert MRSA Assay (FDA approved for NASAL specimens only), is one component of a comprehensive MRSA colonization surveillance program. It is not intended to diagnose MRSA infection nor to guide or monitor treatment for MRSA infections.          Radiology Studies: Ct Head Wo Contrast  Result Date: 05/28/2016 CLINICAL DATA:   Recent fall, loss of consciousness, acute subarachnoid hemorrhage 05/27/2016 EXAM: CT HEAD WITHOUT CONTRAST TECHNIQUE: Contiguous axial images were obtained from the base of the skull through the vertex without intravenous contrast. COMPARISON:  05/27/2016 FINDINGS: Brain: Minimal increase in the small left posterior frontal hyperdense hemorrhagic contusion, image 22. , but best appreciated on the coronal reconstructions. Slight increase in the small amount of anterior right frontal all subarachnoid hemorrhage, image 23. Stable brain atrophy and extensive chronic white matter microvascular ischemic changes throughout both cerebral hemispheres. No significant mass effect, focal edema, midline shift, herniation, hydrocephalus, or extra-axial fluid collection. No interventricular hemorrhage. Cisterns remain patent. Cerebellar atrophy as well.  Vascular: No hyperdense vessel or unexpected calcification. Skull: Negative for skull fracture. Mastoids remain clear. Left frontal orbital soft tissue hematoma again noted. Sinuses/Orbits: Orbits are intact and symmetric. Air-fluid level in the left maxillary sinus as before. Other sinuses remain clear. Other: None. IMPRESSION: Slight increased in the posterior left frontal small hemorrhagic cortical contusion. Slight increase in the small anterior right frontal subarachnoid hemorrhage Stable atrophy and chronic white matter microvascular changes Stable left frontal orbital/ preseptal soft tissue hematoma. Electronically Signed   By: Jerilynn Mages.  Shick M.D.   On: 05/28/2016 09:11   Ct Cervical Spine Wo Contrast  Result Date: 05/27/2016 CLINICAL DATA:  Patient found down today. Hematoma above the left eye. Initial encounter. EXAM: CT CERVICAL SPINE WITHOUT CONTRAST TECHNIQUE: Multidetector CT imaging of the cervical spine was performed without intravenous contrast. Multiplanar CT image reconstructions were also generated. COMPARISON:  None. FINDINGS: Alignment: Trace anterolisthesis  C7 on T1 due to facet degenerative disease is noted. Alignment is otherwise maintained. Skull base and vertebrae: No acute fracture. No primary bone lesion or focal pathologic process. Soft tissues and spinal canal: No prevertebral fluid or swelling. No visible canal hematoma. Disc levels: Mild loss of disc space height and endplate spurring are seen at C6-7. Multilevel facet arthropathy is seen. No obvious central canal or foraminal stenosis. Upper chest: Lung apices are clear. Other: None. IMPRESSION: No acute abnormality.  Mild degenerative disease noted. Electronically Signed   By: Inge Rise M.D.   On: 05/27/2016 21:12   Ct Pelvis Wo Contrast  Result Date: 05/28/2016 CLINICAL DATA:  Unwitnessed fall tonight.  Left hip pain. EXAM: CT PELVIS WITHOUT CONTRAST TECHNIQUE: Multidetector CT imaging of the pelvis was performed following the standard protocol without intravenous contrast. COMPARISON:  Radiographs 05/27/2016 FINDINGS: There are acute minimally displaced fractures of the inferior and superior left pubic rami. There is a nondisplaced left sacral ala fracture. Pubic symphysis is intact. Sacroiliac joints are intact. Left hip is intact. Right hip arthroplasties intact. There is a small extraperitoneal hemorrhage in the pelvis, associated with the pubic ramus fractures. Incidental findings include colonic diverticulosis and moderate atherosclerotic calcification of the iliac arteries. IMPRESSION: Acute fractures of the left pubic rami and left sacrum, nondisplaced to minimally displaced. Small extraperitoneal hemorrhage in the low left hemipelvis associated with the pubic ramus fractures. Hips are intact. Electronically Signed   By: Andreas Newport M.D.   On: 05/28/2016 01:55   Dg Pelvis Portable  Result Date: 05/27/2016 CLINICAL DATA:  Fall and pain.  Initial encounter. EXAM: PORTABLE PELVIS 1-2 VIEWS COMPARISON:  01/22/2013 FINDINGS: AP view of the pelvis. Right hip arthroplasty. No acute  complication. Sacroiliac joints are symmetric. IMPRESSION: No acute osseous abnormality. Electronically Signed   By: Abigail Miyamoto M.D.   On: 05/27/2016 22:40   Dg Chest Port 1 View  Result Date: 05/28/2016 CLINICAL DATA:  82 year old male with fall and shortness of breath. EXAM: PORTABLE CHEST 1 VIEW COMPARISON:  Chest radiograph dated 05/27/2016 FINDINGS: There is eventration of the right hemidiaphragm. The lungs are clear. There is no pleural effusion or pneumothorax. The cardiac silhouette is within normal limits. The aorta is tortuous. There is atherosclerotic calcification of the aorta. Left pectoral pacemaker device. No acute osseous pathology. IMPRESSION: No acute cardiopulmonary process. Electronically Signed   By: Anner Crete M.D.   On: 05/28/2016 04:23   Dg Chest Portable 1 View  Result Date: 05/27/2016 CLINICAL DATA:  Status post fall with rib and head pain. EXAM: PORTABLE CHEST 1 VIEW  COMPARISON:  Chest radiograph 07/13/2012 FINDINGS: Single lead left chest wall pacemaker lead overlies the right ventricle. There is atherosclerotic calcification within the aortic arch. Cardiomediastinal size is otherwise normal. There are scattered bilateral interstitial opacities without overt pulmonary edema. No focal airspace consolidation. No pleural effusion or pneumothorax. No acute osseous abnormality. IMPRESSION: 1. No focal airspace consolidation. 2. Aortic atherosclerosis. 3. No rib fracture. Electronically Signed   By: Ulyses Jarred M.D.   On: 05/27/2016 22:43   Ct Head Code Stroke W/o Cm  Result Date: 05/27/2016 CLINICAL DATA:  Code stroke. 82 year old male found down. Diabetes. Parkinson's disease. Skin cancer. Initial encounter. EXAM: CT HEAD WITHOUT CONTRAST TECHNIQUE: Contiguous axial images were obtained from the base of the skull through the vertex without intravenous contrast. COMPARISON:  01/20/2011 MR.  06/15/2010 CT. FINDINGS: Brain: Posterior left frontal lobe small hemorrhagic  contusion measuring up to 8 mm. Small amount of subarachnoid blood anterior right frontal region. Prominent chronic microvascular changes without CT evidence of large acute infarct. Global atrophy without hydrocephalus. No intracranial mass lesion noted on this unenhanced exam. Vascular: No hyperdense vessel.  Vascular calcifications. Skull: No skull fracture Sinuses/Orbits: Left preseptal moderately large hematoma. The underlying globe appears to be grossly intact however there is a left orbital floor fracture with slight displacement. No entrapment of the inferior rectus muscle. Other: Negative ASPECTS ([place] Stroke Program Early CT Score) - Ganglionic level infarction (caudate, lentiform nuclei, internal capsule, insula, M1-M3 cortex): 7 - Supraganglionic infarction (M4-M6 cortex): 3 Total score (0-10 with 10 being normal): 10 IMPRESSION: 1. Posterior left frontal lobe small hemorrhagic contusion measuring up to 8 mm. Small amount of subarachnoid blood anterior right frontal region. Prominent chronic microvascular changes without CT evidence of large acute infarct. Global atrophy without hydrocephalus. Left preseptal moderately large hematoma. The underlying globe appears to be grossly intact however there is a left orbital floor fracture with slight displacement. No entrapment of the inferior rectus muscle. 1. ASPECTS is 10 These results were called by telephone at the time of interpretation on 05/27/2016 at 8:54 Pm to Dr. Elnora Morrison , who verbally acknowledged these results. Upon initially calling Dr. Nicole Kindred, code stroke was canceled. Electronically Signed   By: Genia Del M.D.   On: 05/27/2016 21:07   Ct Maxillofacial Wo Contrast  Result Date: 05/27/2016 CLINICAL DATA:  Recent fall EXAM: CT MAXILLOFACIAL WITHOUT CONTRAST TECHNIQUE: Multidetector CT imaging of the maxillofacial structures was performed. Multiplanar CT image reconstructions were also generated. A small metallic BB was placed on the  right temple in order to reliably differentiate right from left. COMPARISON:  None. FINDINGS: Osseous: Degenerative changes of the cervical spine are noted. Mild blowout fracture of the inferior orbital wall on the left is noted. No muscular entrapment is noted although the inferior rectus muscle lies adjacent to the bony defect. Orbits: Postsurgical changes are noted in both globes. Sinuses: Mild mucosal changes are noted in the left maxillary antrum consistent with the adjacent blowout fracture. Soft tissues: Considerable soft tissue swelling is noted in the left periorbital region. No other soft tissue abnormality is noted. Limited intracranial: Atrophic changes are noted. IMPRESSION: Left inferior orbital wall blowout fracture with only mild displacement of the inferior fracture fragment. The left inferior rectus approaches the bony defect although does not appear entrapped. The muscle is somewhat edematous. Significant left periorbital soft tissue swelling. No other focal abnormality is noted. Electronically Signed   By: Inez Catalina M.D.   On: 05/27/2016 21:22  Scheduled Meds: . brimonidine  2 drop Both Eyes BID  . carbidopa-levodopa  1 tablet Oral TID WC  . diltiazem  180 mg Oral Daily  . insulin aspart  0-5 Units Subcutaneous QHS  . insulin aspart  0-9 Units Subcutaneous TID WC  . pravastatin  20 mg Oral QHS   Continuous Infusions: . sodium chloride 75 mL/hr at 05/29/16 0600     LOS: 0 days    Time spent: 30 minutes    Seydina Holliman, MD [facility] Pager [phone] If 7PM-7AM, please contact night-coverage [email or web address] Password [facility] 05/29/2016, 4:50 PM

## 2016-05-30 LAB — BASIC METABOLIC PANEL
ANION GAP: 8 (ref 5–15)
BUN: 18 mg/dL (ref 6–20)
CALCIUM: 8.1 mg/dL — AB (ref 8.9–10.3)
CO2: 22 mmol/L (ref 22–32)
Chloride: 109 mmol/L (ref 101–111)
Creatinine, Ser: 0.92 mg/dL (ref 0.61–1.24)
GLUCOSE: 217 mg/dL — AB (ref 65–99)
Potassium: 3.3 mmol/L — ABNORMAL LOW (ref 3.5–5.1)
SODIUM: 139 mmol/L (ref 135–145)

## 2016-05-30 LAB — GLUCOSE, CAPILLARY
GLUCOSE-CAPILLARY: 191 mg/dL — AB (ref 65–99)
GLUCOSE-CAPILLARY: 227 mg/dL — AB (ref 65–99)
GLUCOSE-CAPILLARY: 237 mg/dL — AB (ref 65–99)
Glucose-Capillary: 255 mg/dL — ABNORMAL HIGH (ref 65–99)
Glucose-Capillary: 191 mg/dL — ABNORMAL HIGH (ref 65–99)

## 2016-05-30 LAB — CBC
HEMATOCRIT: 35.8 % — AB (ref 39.0–52.0)
HEMOGLOBIN: 11.6 g/dL — AB (ref 13.0–17.0)
MCH: 30 pg (ref 26.0–34.0)
MCHC: 32.4 g/dL (ref 30.0–36.0)
MCV: 92.5 fL (ref 78.0–100.0)
Platelets: 133 10*3/uL — ABNORMAL LOW (ref 150–400)
RBC: 3.87 MIL/uL — AB (ref 4.22–5.81)
RDW: 13.4 % (ref 11.5–15.5)
WBC: 13.5 10*3/uL — AB (ref 4.0–10.5)

## 2016-05-30 LAB — PROTIME-INR
INR: 1.3
Prothrombin Time: 16.3 seconds — ABNORMAL HIGH (ref 11.4–15.2)

## 2016-05-30 MED ORDER — HYDRALAZINE HCL 20 MG/ML IJ SOLN
5.0000 mg | Freq: Once | INTRAMUSCULAR | Status: AC
  Administered 2016-05-30: 5 mg via INTRAVENOUS
  Filled 2016-05-30: qty 1

## 2016-05-30 MED ORDER — POTASSIUM CHLORIDE CRYS ER 20 MEQ PO TBCR
40.0000 meq | EXTENDED_RELEASE_TABLET | Freq: Two times a day (BID) | ORAL | Status: AC
  Administered 2016-05-30 (×2): 40 meq via ORAL
  Filled 2016-05-30 (×2): qty 2

## 2016-05-30 MED ORDER — INSULIN ASPART 100 UNIT/ML ~~LOC~~ SOLN
0.0000 [IU] | Freq: Three times a day (TID) | SUBCUTANEOUS | Status: DC
  Administered 2016-05-31: 5 [IU] via SUBCUTANEOUS
  Administered 2016-05-31: 11 [IU] via SUBCUTANEOUS
  Administered 2016-05-31: 2 [IU] via SUBCUTANEOUS
  Administered 2016-06-01: 8 [IU] via SUBCUTANEOUS
  Administered 2016-06-01: 5 [IU] via SUBCUTANEOUS

## 2016-05-30 NOTE — Evaluation (Signed)
Physical Therapy Evaluation Patient Details Name: Raymond Navarro MRN: [id number] DOB: 1929/01/09 Today's Date: 05/30/2016   History of Present Illness  Raymond Navarro is a 82 y.o. male with medical history significant of Parkinson's disease, chronic atrial fibrillation, on chronic anticoagulation therapy, s/p PM, and DM type II; who presents after having an unwitnessed fall.  Positive for L orbital fx, L frontal SAH, acute fractures of the left pubic rami and left sacrum, nondisplaced to minimally displaced.  Clinical Impression  Patient presents with decreased independence with mobility due to deficits listed in PT problem list.  He will benefit from skilled PT in the acute setting to address issues and allow return home with wife support following SNF level rehab stay.      Follow Up Recommendations SNF    Equipment Recommendations  None recommended by PT    Recommendations for Other Services       Precautions / Restrictions Precautions Precautions: Fall Restrictions Weight Bearing Restrictions: No Other Position/Activity Restrictions: no weight bearing precautions currently; paged MD who will check with ortho; performed OOB to chair only      Mobility  Bed Mobility Overal bed mobility: Needs Assistance Bed Mobility: Supine to Sit     Supine to sit: HOB elevated;Max assist     General bed mobility comments: assist to move legs off bed and to lift trunk upright  Transfers Overall transfer level: Needs assistance Equipment used: Rolling walker (2 wheeled) Transfers: Sit to/from [facility] Sit to Stand: Mod assist;Max assist Stand pivot transfers: Mod assist       General transfer comment: lifting help, able to lower slowly to chair with UE support and minguard for safety with cues; difficutly transitioning hands from armrests to walker upon standing; pivotal steps to [facility], then to recliner with assist and difficulty with weight bearing L  LE  Ambulation/Gait                Stairs            Wheelchair Mobility    Modified Rankin (Stroke Patients Only)       Balance Overall balance assessment: Needs assistance   Sitting balance-Leahy Scale: Fair     Standing balance support: Bilateral upper extremity supported Standing balance-Leahy Scale: Poor Standing balance comment: heavy UE support on walker while assisted for hygiene after toileting                             Pertinent Vitals/Pain Pain Assessment: 0-10 Pain Location: no pain at rest; L pelvic pain with mobility Pain Descriptors / Indicators: Discomfort;Sore;Aching Pain Intervention(s): Monitored during session;Limited activity within patient's tolerance;Repositioned    Home Living Family/patient expects to be discharged to:: Private residence ([place] [facility]) Living Arrangements: Spouse/significant other Available Help at Discharge: Family;Available 24 hours/day Type of Home: House Home Access: Level entry     Home Layout: One level Home Equipment: Walker - 4 wheels;Cane - single point;Grab bars - tub/shower;Grab bars - toilet;Shower seat      Prior Function Level of Independence: Independent         Comments: wasn't using walking device, about 2 falls in 6 monhts, usually able to get up on his own     Hand Dominance        Extremity/Trunk Assessment   Upper Extremity Assessment Upper Extremity Assessment: Generalized weakness    Lower Extremity Assessment Lower Extremity Assessment: Generalized weakness;LLE deficits/detail LLE Deficits /  Details: lifts lower leg, but unable to lift from hip due to pain; buckles with attempts at weight bearing    Cervical / Trunk Assessment Cervical / Trunk Assessment: Kyphotic  Communication   Communication: No difficulties  Cognition Arousal/Alertness: Awake/alert Behavior During Therapy: WFL for tasks assessed/performed Overall Cognitive Status: Within  Functional Limits for tasks assessed                      General Comments      Exercises     Assessment/Plan    PT Assessment Patient needs continued PT services  PT Problem List Decreased strength;Decreased mobility;Decreased activity tolerance;Decreased balance;Pain;Decreased knowledge of use of DME;Decreased range of motion          PT Treatment Interventions DME instruction;Therapeutic activities;Therapeutic exercise;Patient/family education;Balance training;Functional mobility training    PT Goals (Current goals can be found in the Care Plan section)  Acute Rehab PT Goals Patient Stated Goal: To go to rehab PT Goal Formulation: With patient Time For Goal Achievement: 06/06/16 Potential to Achieve Goals: Fair    Frequency Min 3X/week   Barriers to discharge        Co-evaluation               End of Session Equipment Utilized During Treatment: Gait belt Activity Tolerance: Patient limited by pain Patient left: in chair;with call bell/phone within reach;with chair alarm set           Time: [id number] PT Time Calculation (min) (ACUTE ONLY): 24 min   Charges:   PT Evaluation $PT Eval High Complexity: 1 Procedure PT Treatments $Therapeutic Activity: 8-22 mins   PT G CodesReginia Naas 2016/06/13, 5:01 PM  Magda Kiel, [place] 06/13/16

## 2016-05-30 NOTE — Progress Notes (Signed)
Patient transferred to [id number]. All belongings sent with patient. CCMD and Elink notified of transfer. Spouse at bedside.   Joellen Jersey, RN.

## 2016-05-30 NOTE — Progress Notes (Signed)
Pt admitted to the unit as transfer from 3S. Pt A&O x3 upon arrival to the unit; pt and spouse at bedside both oriented to the unit and room; fall / safety precaution and prevention education completed with pt and spouse, both voices understanding and denies any questions. Telemetry applied and verified with CCMD and NT called to second verify. VSS; bed alarm on; pt has no pressure ulcers noted but small skin tear to left upper arm with foam dsg; left eye and left face has trauma with bruising and eye redden. Sutures to laceration and scabbed old blood to lips. Pt in bed with call light within reach and bed alarm on. Will continue to closely monitor. Delia Heady RN

## 2016-05-30 NOTE — Progress Notes (Signed)
PROGRESS NOTE    Raymond Navarro  [id number] DOB: Mar 06, 1929 DOA: 05/27/2016 PCP: Rusty Aus, MD   Brief Narrative: Raymond Navarro is a 82 y.o. male with medical history significant of Parkinson's disease, chronic atrial fibrillation, on chronic anticoagulation therapy, s/p PM, and DM type II; who presents after having an unwitnessed fall.  CT scan of the brain which showed the left frontal hematoma, small subarachnoid hemorrhage, and fracture to the left orbit. Dr. Sherley Bounds of Neurosurgery  and Dr. Melissa Montane of trauma surgery work, consulted while the patient was in the ED. Neurosurgery recommended follow-up CT in a.m. Trauma surgery evaluated  the patient for his facial lacerations and left orbital blowout fracture. He received stitches and was recommended to follow-up in one week for reexamination in the office.   Assessment & Plan:   Principal Problem:   Traumatic subarachnoid bleed with LOC of 30 minutes or less, initial encounter ([place]) Active Problems:   Pacemaker-Medtronic   Atrial fibrillation (HCC)   Facial laceration   Fracture of left orbital floor (HCC)   Diabetes mellitus type 2 in nonobese (HCC)   Parkinson's disease ([place])   Hyperlipidemia   Hypokalemia   Small sub arachnoid hemorrhage following an unwitnessed fall:  - admitted to step down and repeat CT  showed  Slight increased in the posterior left frontal small hemorrhagic cortical contusion. Currently he is alert and oriented and no new complaints.   Slight increase in the small anterior right frontal subarachnoid Hemorrhage. No change in mental status.   Discussed with Dr Joya Salm on call neuro surgery regarding the above findings, recommended to follow up with neurosurgery office in one week with a repeat CT of the head without contrast .  Discussed the bove with the patient and his wife at bedside.   PT evaluation ordered.   Chronic atrial fibrillation:  Rate controlled. Off anticoagulation  due to sub arachnoid hemorrhage. Dicussed the risks of anticoagulation vs benefits, pts' wife would like him to be off anti coagulation. This is something, they have to discuss with their cardiology.  Patient 's eife reports that he has a fall every year over the last 6 years.   Fracture of the left orbital floor.  Pain control. Outpatient follow up with trauma surgery as recommended.   Diabetes mellitus: CBG (last 3)   Recent Labs  05/30/16 1144 05/30/16 1210 05/30/16 1604  GLUCAP 255* 227* 237*   Changed to moderate SSI.  hgba1c ordered.   Hypertension: Controlled.    Leukocytosis: Reactive.  CXR and UA is negative for infection.  Repeat CBC in am.   Pelvic ramus fracture on the left:  Pain control and weight bearing as tolerated.  Physical therapy.   DVT prophylaxis: SCD'S Code Status: (Full/) Family Communication: none at bedside.  Disposition Plan: SNF possibly in 1 to 2 days.   Consultants:   Neuro surgery.    Procedures: repeat CT head.   Antimicrobials: none.   Subjective: Alert and denies any new complaints.   Objective: Vitals:   05/30/16 0419 05/30/16 0800 05/30/16 1232 05/30/16 1743  BP: (!) 156/78 (!) 170/88 (!) 149/77 (!) 161/63  Pulse: 92 97 87 88  Resp: (!) 26 20 20 20   Temp:  98 F (36.7 C) 98.8 F (37.1 C) 98.2 F (36.8 C)  TempSrc:  Oral Oral Oral  SpO2: 98% 97% 97% 98%  Weight:      Height:        Intake/Output Summary (Last 24  hours) at 05/30/16 1804 Last data filed at 05/30/16 0800  Gross per 24 hour  Intake              435 ml  Output                0 ml  Net              435 ml   Filed Weights   05/27/16 2100  Weight: 62.6 kg (138 lb)    Examination:  General exam: Appears calm and comfortable , laceration over the left brow.  Respiratory system: Clear to auscultation. Respiratory effort normal. Cardiovascular system: S1 & S2 heard, irregular. No JVD, murmurs, rubs, gallops or clicks. No pedal  edema. Gastrointestinal system: Abdomen is nondistended, soft and nontender. No organomegaly or masses felt. Normal bowel sounds heard. Central nervous system: alert and comfortable. Able to move extremities.  Extremities: no edema. Skin: No rashes, lesions or ulcers     Data Reviewed: I have personally reviewed following labs and imaging studies  CBC:  Recent Labs Lab 05/27/16 2036 05/27/16 2048 05/28/16 0428 05/30/16 0412  WBC 14.7*  --  14.9* 13.5*  NEUTROABS 12.2*  --   --   --   HGB 14.4 15.0 13.9 11.6*  HCT 42.6 44.0 42.6 35.8*  MCV 88.0  --  90.4 92.5  PLT 171  --  174 [id number]*   Basic Metabolic Panel:  Recent Labs Lab 05/27/16 2036 05/27/16 2048 05/28/16 0428 05/30/16 0412  NA 132* 139 135 139  K 3.0* 3.3* 3.8 3.3*  CL 95* 99* 99* 109  CO2 27  --  23 22  GLUCOSE 212* 210* 363* 217*  BUN 16 19 16 18   CREATININE 1.02 0.80 1.10 0.92  CALCIUM 8.7*  --  8.7* 8.1*   GFR: Estimated Creatinine Clearance: 52 mL/min (by C-G formula based on SCr of 0.92 mg/dL). Liver Function Tests:  Recent Labs Lab 05/27/16 2036  AST 29  ALT 7*  ALKPHOS 87  BILITOT 1.2  PROT 6.6  ALBUMIN 4.0   No results for input(s): LIPASE, AMYLASE in the last 168 hours. No results for input(s): AMMONIA in the last 168 hours. Coagulation Profile:  Recent Labs Lab 05/28/16 0428 05/28/16 1132 05/28/16 1613 05/29/16 0557 05/30/16 0412  INR 1.25 1.28 1.26 1.26 1.30   Cardiac Enzymes: No results for input(s): CKTOTAL, CKMB, CKMBINDEX, TROPONINI in the last 168 hours. BNP (last 3 results) No results for input(s): PROBNP in the last 8760 hours. HbA1C: No results for input(s): HGBA1C in the last 72 hours. CBG:  Recent Labs Lab 05/29/16 2107 05/30/16 0829 05/30/16 1144 05/30/16 1210 05/30/16 1604  GLUCAP 167* 191* 255* 227* 237*   Lipid Profile: No results for input(s): CHOL, HDL, LDLCALC, TRIG, CHOLHDL, LDLDIRECT in the last 72 hours. Thyroid Function Tests: No results for  input(s): TSH, T4TOTAL, FREET4, T3FREE, THYROIDAB in the last 72 hours. Anemia Panel: No results for input(s): VITAMINB12, FOLATE, FERRITIN, TIBC, IRON, RETICCTPCT in the last 72 hours. Sepsis Labs: No results for input(s): PROCALCITON, LATICACIDVEN in the last 168 hours.  Recent Results (from the past 240 hour(s))  MRSA PCR Screening     Status: None   Collection Time: 05/28/16  4:31 PM  Result Value Ref Range Status   MRSA by PCR NEGATIVE NEGATIVE Final    Comment:        The GeneXpert MRSA Assay (FDA approved for NASAL specimens only), is one component of a comprehensive MRSA colonization surveillance  program. It is not intended to diagnose MRSA infection nor to guide or monitor treatment for MRSA infections.          Radiology Studies: No results found.      Scheduled Meds: . brimonidine  2 drop Both Eyes BID  . carbidopa-levodopa  1 tablet Oral TID WC  . diltiazem  180 mg Oral Daily  . insulin aspart  0-5 Units Subcutaneous QHS  . insulin aspart  0-9 Units Subcutaneous TID WC  . potassium chloride  40 mEq Oral BID  . pravastatin  20 mg Oral QHS   Continuous Infusions: . sodium chloride 75 mL/hr at 05/30/16 1314     LOS: 1 day    Time spent: 52 minutes    Mara Favero, MD [facility] Pager [phone] If 7PM-7AM, please contact night-coverage [email or web address] Password [facility] 05/30/2016, 6:04 PM

## 2016-05-31 LAB — BASIC METABOLIC PANEL
Anion gap: 6 (ref 5–15)
BUN: 20 mg/dL (ref 6–20)
CALCIUM: 8.1 mg/dL — AB (ref 8.9–10.3)
CO2: 26 mmol/L (ref 22–32)
CREATININE: 0.96 mg/dL (ref 0.61–1.24)
Chloride: 110 mmol/L (ref 101–111)
Glucose, Bld: 230 mg/dL — ABNORMAL HIGH (ref 65–99)
Potassium: 3.8 mmol/L (ref 3.5–5.1)
Sodium: 142 mmol/L (ref 135–145)

## 2016-05-31 LAB — HEMOGLOBIN A1C
HEMOGLOBIN A1C: 7.2 % — AB (ref 4.8–5.6)
MEAN PLASMA GLUCOSE: 160 mg/dL

## 2016-05-31 LAB — GLUCOSE, CAPILLARY
GLUCOSE-CAPILLARY: 204 mg/dL — AB (ref 65–99)
GLUCOSE-CAPILLARY: 302 mg/dL — AB (ref 65–99)
Glucose-Capillary: 140 mg/dL — ABNORMAL HIGH (ref 65–99)
Glucose-Capillary: 178 mg/dL — ABNORMAL HIGH (ref 65–99)

## 2016-05-31 LAB — CBC
HCT: 36.9 % — ABNORMAL LOW (ref 39.0–52.0)
Hemoglobin: 12.1 g/dL — ABNORMAL LOW (ref 13.0–17.0)
MCH: 29.6 pg (ref 26.0–34.0)
MCHC: 32.8 g/dL (ref 30.0–36.0)
MCV: 90.2 fL (ref 78.0–100.0)
Platelets: 181 10*3/uL (ref 150–400)
RBC: 4.09 MIL/uL — ABNORMAL LOW (ref 4.22–5.81)
RDW: 13 % (ref 11.5–15.5)
WBC: 13.1 10*3/uL — ABNORMAL HIGH (ref 4.0–10.5)

## 2016-05-31 MED ORDER — HYDRALAZINE HCL 10 MG PO TABS
10.0000 mg | ORAL_TABLET | Freq: Three times a day (TID) | ORAL | Status: DC
  Administered 2016-05-31 – 2016-06-01 (×4): 10 mg via ORAL
  Filled 2016-05-31 (×4): qty 1

## 2016-05-31 MED ORDER — HYDRALAZINE HCL 20 MG/ML IJ SOLN
5.0000 mg | INTRAMUSCULAR | Status: DC | PRN
  Administered 2016-06-01: 5 mg via INTRAVENOUS
  Filled 2016-05-31: qty 1

## 2016-05-31 MED ORDER — AMLODIPINE BESYLATE 5 MG PO TABS: 5.0000 mg | ORAL_TABLET | Freq: Every day | ORAL | Status: DC

## 2016-05-31 NOTE — Clinical Social Work Placement (Signed)
   CLINICAL SOCIAL WORK PLACEMENT  NOTE  Date:  05/31/2016  Patient Details  Name: Raymond Navarro MRN: [id number] Date of Birth: April 11, 1929  Clinical Social Work is seeking post-discharge placement for this patient at the [place] level of care (*CSW will initial, date and re-position this form in  chart as items are completed):  Yes   Patient/family provided with [place] Work Department's list of facilities offering this level of care within the geographic area requested by the patient (or if unable, by the patient's family).  Yes   Patient/family informed of their freedom to choose among providers that offer the needed level of care, that participate in Medicare, Medicaid or managed care program needed by the patient, have an available bed and are willing to accept the patient.  Yes   Patient/family informed of [facility]'s ownership interest in [facility] and [facility], as well as of the fact that they are under no obligation to receive care at these facilities.  PASRR submitted to EDS on       PASRR number received on       Existing PASRR number confirmed on 05/31/16     FL2 transmitted to all facilities in geographic area requested by pt/family on 05/31/16     FL2 transmitted to all facilities within larger geographic area on       Patient informed that his/her managed care company has contracts with or will negotiate with certain facilities, including the following:            Patient/family informed of bed offers received.  Patient chooses bed at       Physician recommends and patient chooses bed at      Patient to be transferred to   on  .  Patient to be transferred to facility by       Patient family notified on   of transfer.  Name of family member notified:        PHYSICIAN       Additional Comment:    _______________________________________________ Darden Dates, LCSW 05/31/2016, 5:03 PM

## 2016-05-31 NOTE — NC FL2 (Signed)
[place] LEVEL OF CARE SCREENING TOOL     IDENTIFICATION  Patient Name: Raymond Navarro Birthdate: 1928/06/30 Sex: male Admission Date (Current Location): 05/27/2016  [facility] and [place] Number:  Herbalist and Address:  [facility] [place], [place] [address] [place], [place] [address]      Provider Number: [id number]  Attending Physician Name and Address:  Caren Griffins, MD  Relative Name and Phone Number:       Current Level of Care: Hospital Recommended Level of Care: [place] Prior Approval Number:    Date Approved/Denied:   PASRR Number: [id number]  Discharge Plan: SNF    Current Diagnoses: Patient Active Problem List   Diagnosis Date Noted  . Traumatic subarachnoid bleed with LOC of 30 minutes or less, initial encounter ([place]) 05/28/2016  . Facial laceration 05/28/2016  . Fracture of left orbital floor ([place]) 05/28/2016  . Diabetes mellitus type 2 in nonobese ([place]) 05/28/2016  . Parkinson's disease ([place]) 05/28/2016  . Hyperlipidemia 05/28/2016  . Hypokalemia 05/28/2016  . Preop cardiovascular exam 01/09/2013  . Atrial fibrillation ([place]) 04/10/2012  . Pacemaker-Medtronic 01/12/2012    Orientation RESPIRATION BLADDER Height & Weight     Self, Time, Situation, Place  Normal Incontinent Weight: 138 lb (62.6 kg) Height:  5\' 10"  (177.8 cm)  BEHAVIORAL SYMPTOMS/MOOD NEUROLOGICAL BOWEL NUTRITION STATUS      Incontinent Diet (Heart Healthy, Thin Liquids)  AMBULATORY STATUS COMMUNICATION OF NEEDS Skin   Extensive Assist Verbally Normal                       Personal Care Assistance Level of Assistance  Bathing, Feeding, Dressing Bathing Assistance: Limited assistance Feeding assistance: Independent Dressing Assistance: Limited assistance     Functional Limitations Info  Sight, Hearing, Speech Sight Info: Adequate Hearing Info: Adequate Speech Info: Adequate    SPECIAL CARE FACTORS FREQUENCY   PT (By licensed PT)     PT Frequency: 5              Contractures Contractures Info: Not present    Additional Factors Info  Code Status, Allergies Code Status Info: DNR Allergies Info: Niacin and Related           Current Medications (05/31/2016):  This is the current hospital active medication list Current Facility-Administered Medications  Medication Dose Route Frequency Provider Last Rate Last Dose  . acetaminophen (TYLENOL) tablet 650 mg  650 mg Oral Q6H PRN Norval Morton, MD   650 mg at 05/30/16 2032   Or  . acetaminophen (TYLENOL) suppository 650 mg  650 mg Rectal Q6H PRN Norval Morton, MD      . albuterol (PROVENTIL) (2.5 MG/3ML) 0.083% nebulizer solution 2.5 mg  2.5 mg Nebulization Q2H PRN Norval Morton, MD      . brimonidine (ALPHAGAN) 0.2 % ophthalmic solution 2 drop  2 drop Both Eyes BID Norval Morton, MD   2 drop at 05/31/16 0925  . carbidopa-levodopa (SINEMET IR) 25-100 MG per tablet immediate release 1 tablet  1 tablet Oral TID WC Norval Morton, MD   1 tablet at 05/31/16 1151  . diltiazem (CARDIZEM CD) 24 hr capsule 180 mg  180 mg Oral Daily Rondell A Tamala Jovanie, MD   180 mg at 05/31/16 0924  . hydrALAZINE (APRESOLINE) injection 5 mg  5 mg Intravenous Q4H PRN Caren Griffins, MD      . hydrALAZINE (APRESOLINE) tablet 10 mg  10 mg Oral Q8H Costin Karlyne Greenspan, MD   10 mg at 05/31/16 1421  . insulin aspart (novoLOG) injection 0-15 Units  0-15 Units Subcutaneous TID WC Hosie Poisson, MD   11 Units at 05/31/16 1150  . insulin aspart (novoLOG) injection 0-5 Units  0-5 Units Subcutaneous QHS Rondell A Smith, MD      . ondansetron (ZOFRAN) tablet 4 mg  4 mg Oral Q6H PRN Norval Morton, MD       Or  . ondansetron (ZOFRAN) injection 4 mg  4 mg Intravenous Q6H PRN Rondell A Tamala Lorie, MD      . pravastatin (PRAVACHOL) tablet 20 mg  20 mg Oral QHS Norval Morton, MD   20 mg at 05/30/16 2032     Discharge Medications: Please see discharge summary for a list of  discharge medications.  Relevant Imaging Results:  Relevant Lab Results:   Additional Information SSN:  [id number]  Darden Dates, LCSW

## 2016-05-31 NOTE — Progress Notes (Signed)
PROGRESS NOTE  Raymond Navarro [id number] DOB: 1928-08-07 DOA: 05/27/2016 PCP: Raymond Aus, MD   LOS: 2 days   Brief Narrative: Raymond Navarro a 82 y.o.malewith medical history significant of Parkinson's disease, chronic atrial fibrillation, on chronic anticoagulation therapy, s/p PM, and DM type II; who presents after having an unwitnessed fall. CT scan of the brain which showed the left frontal hematoma, small subarachnoid hemorrhage, and fracture to the left orbit. Dr. Sherley Bounds of Neurosurgery and Dr. Melissa Montane of trauma surgery work, consulted while the patient was in the ED. Neurosurgery recommended follow-up CT in a.m. Trauma surgery evaluated the patient for his facial lacerations and left orbital blowout fracture. He received stitches and was recommended to follow-up in one week for reexamination in the office.   Assessment & Plan: Principal Problem:   Traumatic subarachnoid bleed with LOC of 30 minutes or less, initial encounter ([name]) Active Problems:   Pacemaker-Medtronic   Atrial fibrillation (HCC)   Facial laceration   Fracture of left orbital floor (HCC)   Diabetes mellitus type 2 in nonobese (HCC)   Parkinson's disease ([name])   Hyperlipidemia   Hypokalemia   Small sub arachnoid hemorrhage following an unwitnessed fall - Neurosurgery was consulted, patient was initially admitted to step down, and underwent a repeat CT scan the next day which showed slight increased in the posterior left frontal small hemorrhagic cortical contusion. Currently he is alert and oriented and no new complaints. Dr. Karleen Hampshire discussed with Dr Joya Salm on call neuro surgery regarding the above findings, recommended to follow up with neurosurgery office in one week with a repeat CT of the head without contrast .   Chronic atrial fibrillation - Rate controlled. Off anticoagulation due to sub arachnoid hemorrhage. Dicussed the risks of anticoagulation vs benefits, pts' wife would like him  to be off anti coagulation. This is something, they have to discuss with their cardiology.   Fracture of the left orbital floor - Pain control. Outpatient follow up with trauma surgery as recommended.   Diabetes mellitus - SSI  Hypertension - Poorly controlled today, add hydralazine and closely monitor. Will need systolic less than 0000000  Leukocytosis - Reactive, CXR and UA is negative for infection  Pelvic ramus fracture on the left - Pain control and weight bearing as tolerated.  - SNF d/c 1-2 days   DVT prophylaxis: SCD Code Status: DNR Family Communication: no family bedside Disposition Plan: SNF when BP improved  Consultants:   Neurosurgery   Procedures:   None   Antimicrobials:  None    Subjective: - no chest pain, shortness of breath, no abdominal pain, nausea or vomiting. Feels better  Objective: Vitals:   05/31/16 0047 05/31/16 0520 05/31/16 0917 05/31/16 1124  BP: (!) 149/78 (!) 179/83 (!) 181/94 (!) 172/99  Pulse: 88 88 97 94  Resp: 18 20 18 18   Temp: 98.3 F (36.8 C) 98.6 F (37 C) 98.6 F (37 C) 98.3 F (36.8 C)  TempSrc: Oral Axillary Oral Oral  SpO2: 98% 95% 96% 96%  Weight:      Height:        Intake/Output Summary (Last 24 hours) at 05/31/16 1344 Last data filed at 05/31/16 0752  Gross per 24 hour  Intake                0 ml  Output              975 ml  Net             -  975 ml   Filed Weights   05/27/16 2100  Weight: 62.6 kg (138 lb)    Examination: Constitutional: NAD Vitals:   05/31/16 0047 05/31/16 0520 05/31/16 0917 05/31/16 1124  BP: (!) 149/78 (!) 179/83 (!) 181/94 (!) 172/99  Pulse: 88 88 97 94  Resp: 18 20 18 18   Temp: 98.3 F (36.8 C) 98.6 F (37 C) 98.6 F (37 C) 98.3 F (36.8 C)  TempSrc: Oral Axillary Oral Oral  SpO2: 98% 95% 96% 96%  Weight:      Height:       Eyes: PERRL, ecchymosis noted ENMT: Mucous membranes are moist Neck: normal, supple, no masses, no thyromegaly Respiratory: clear to  auscultation bilaterally, no wheezing, no crackles Cardiovascular: Regular rate and rhythm, no murmurs / rubs / gallops.  Abdomen: no tenderness. Bowel sounds positive.  Musculoskeletal: no clubbing / cyanosis.  Neurologic: non focal    Data Reviewed: I have personally reviewed following labs and imaging studies  CBC:  Recent Labs Lab 05/27/16 2036 05/27/16 2048 05/28/16 0428 05/30/16 0412 05/31/16 0516  WBC 14.7*  --  14.9* 13.5* 13.1*  NEUTROABS 12.2*  --   --   --   --   HGB 14.4 15.0 13.9 11.6* 12.1*  HCT 42.6 44.0 42.6 35.8* 36.9*  MCV 88.0  --  90.4 92.5 90.2  PLT 171  --  174 133* 0000000   Basic Metabolic Panel:  Recent Labs Lab 05/27/16 2036 05/27/16 2048 05/28/16 0428 05/30/16 0412 05/31/16 0516  NA 132* 139 135 139 142  K 3.0* 3.3* 3.8 3.3* 3.8  CL 95* 99* 99* 109 110  CO2 27  --  23 22 26   GLUCOSE 212* 210* 363* 217* 230*  BUN 16 19 16 18 20   CREATININE 1.02 0.80 1.10 0.92 0.96  CALCIUM 8.7*  --  8.7* 8.1* 8.1*   GFR: Estimated Creatinine Clearance: 49.8 mL/min (by C-G formula based on SCr of 0.96 mg/dL). Liver Function Tests:  Recent Labs Lab 05/27/16 2036  AST 29  ALT 7*  ALKPHOS 87  BILITOT 1.2  PROT 6.6  ALBUMIN 4.0   No results for input(s): LIPASE, AMYLASE in the last 168 hours. No results for input(s): AMMONIA in the last 168 hours. Coagulation Profile:  Recent Labs Lab 05/28/16 0428 05/28/16 1132 05/28/16 1613 05/29/16 0557 05/30/16 0412  INR 1.25 1.28 1.26 1.26 1.30   Cardiac Enzymes: No results for input(s): CKTOTAL, CKMB, CKMBINDEX, TROPONINI in the last 168 hours. BNP (last 3 results) No results for input(s): PROBNP in the last 8760 hours. HbA1C:  Recent Labs  05/30/16 1829  HGBA1C 7.2*   CBG:  Recent Labs Lab 05/30/16 1210 05/30/16 1604 05/30/16 2130 05/31/16 0615 05/31/16 1132  GLUCAP 227* 237* 191* 204* 302*   Lipid Profile: No results for input(s): CHOL, HDL, LDLCALC, TRIG, CHOLHDL, LDLDIRECT in the  last 72 hours. Thyroid Function Tests: No results for input(s): TSH, T4TOTAL, FREET4, T3FREE, THYROIDAB in the last 72 hours. Anemia Panel: No results for input(s): VITAMINB12, FOLATE, FERRITIN, TIBC, IRON, RETICCTPCT in the last 72 hours. Urine analysis:    Component Value Date/Time   COLORURINE YELLOW 05/27/2016 2322   APPEARANCEUR CLEAR 05/27/2016 2322   APPEARANCEUR Hazy 01/13/2013 1000   LABSPEC 1.013 05/27/2016 2322   LABSPEC 1.023 01/13/2013 1000   PHURINE 7.0 05/27/2016 2322   GLUCOSEU 150 (A) 05/27/2016 2322   GLUCOSEU Negative 01/13/2013 1000   HGBUR SMALL (A) 05/27/2016 2322   BILIRUBINUR NEGATIVE 05/27/2016 2322   BILIRUBINUR  Negative 01/13/2013 1000   KETONESUR 20 (A) 05/27/2016 2322   PROTEINUR 100 (A) 05/27/2016 2322   NITRITE NEGATIVE 05/27/2016 2322   LEUKOCYTESUR NEGATIVE 05/27/2016 2322   LEUKOCYTESUR Negative 01/13/2013 1000   Sepsis Labs: Invalid input(s): PROCALCITONIN, LACTICIDVEN  Recent Results (from the past 240 hour(s))  MRSA PCR Screening     Status: None   Collection Time: 05/28/16  4:31 PM  Result Value Ref Range Status   MRSA by PCR NEGATIVE NEGATIVE Final    Comment:        The GeneXpert MRSA Assay (FDA approved for NASAL specimens only), is one component of a comprehensive MRSA colonization surveillance program. It is not intended to diagnose MRSA infection nor to guide or monitor treatment for MRSA infections.       Radiology Studies: No results found.   Scheduled Meds: . brimonidine  2 drop Both Eyes BID  . carbidopa-levodopa  1 tablet Oral TID WC  . diltiazem  180 mg Oral Daily  . hydrALAZINE  10 mg Oral Q8H  . insulin aspart  0-15 Units Subcutaneous TID WC  . insulin aspart  0-5 Units Subcutaneous QHS  . pravastatin  20 mg Oral QHS   Continuous Infusions:  Marzetta Board, MD, PhD [facility] Pager [phone]  If 7PM-7AM, please contact night-coverage [email or web address] Password [email or web address] 05/31/2016, 1:44 PM

## 2016-05-31 NOTE — Clinical Social Work Note (Signed)
Clinical Social Work Assessment  Patient Details  Name: Raymond Navarro MRN: [id number] Date of Birth: 23-Feb-1929  Date of referral:  05/31/16               Reason for consult:  Facility Placement, Discharge Planning                Permission sought to share information with:  Family Supports Permission granted to share information::  Yes, Verbal Permission Granted  Name::     Raymond Navarro  Relationship::  spouse  Contact Information:  [phone]  Housing/Transportation Living arrangements for the past 2 months:  [place] of Information:  Patient, Spouse Patient Interpreter Needed:  None Criminal Activity/Legal Involvement Pertinent to Current Situation/Hospitalization:  No - Comment as needed Significant Relationships:  Spouse Lives with:  Spouse Do you feel safe going back to the place where you live?  No Need for family participation in patient care:  Yes (Comment)  Care giving concerns:  No care giving concerns identified.   Social Worker assessment / plan:  CSW met with pt and wife to address consult. CSW introduced herself and explained role of social work. CSW also explained the process of discharging to SNF with medicare. Pt is a resident of [place], and a bed at healthcare. Pt is able to discharge to [facility] at discharge. CSW will continue to follow.   Employment status:  Retired Forensic scientist:  Medicare PT Recommendations:  [place] / Referral to community resources:  [place] ([place])  Patient/Family's Response to care:  Pt's wife was appreciative of CSW support.   Patient/Family's Understanding of and Emotional Response to Diagnosis, Current Treatment, and Prognosis:  Pt and wife understand that pt will need STR at discharge.   Emotional Assessment Appearance:  Appears stated age Attitude/Demeanor/Rapport:   (Appropriate) Affect (typically observed):  Accepting, Adaptable,  Pleasant Orientation:  Oriented to Self, Oriented to Place, Oriented to  Time, Oriented to Situation Alcohol / Substance use:  Not Applicable Psych involvement (Current and /or in the community):  No (Comment)  Discharge Needs  Concerns to be addressed:  Adjustment to Illness Readmission within the last 30 days:  No Current discharge risk:  Chronically ill Barriers to Discharge:  Continued Medical Work up   Darden Dates, LCSW 05/31/2016, 4:51 PM

## 2016-05-31 NOTE — Progress Notes (Signed)
Inpatient Diabetes Program Recommendations  AACE/ADA: New Consensus Statement on Inpatient Glycemic Control (2015)  Target Ranges:  Prepandial:   less than 140 mg/dL      Peak postprandial:   less than 180 mg/dL (1-2 hours)      Critically ill patients:  140 - 180 mg/dL   Lab Results  Component Value Date   GLUCAP 302 (H) 05/31/2016   HGBA1C 7.2 (H) 05/30/2016    Review of Glycemic Control  May benefit from basal insulin.  Inpatient Diabetes Program Recommendations:    Add Lantus 5 units QHS  Will continue to follow. Thank you. Lorenda Peck, RD, LDN, CDE Inpatient Diabetes Coordinator [phone]

## 2016-06-01 LAB — GLUCOSE, CAPILLARY
GLUCOSE-CAPILLARY: 244 mg/dL — AB (ref 65–99)
GLUCOSE-CAPILLARY: 297 mg/dL — AB (ref 65–99)

## 2016-06-01 MED ORDER — HYDRALAZINE HCL 25 MG PO TABS: 25.0000 mg | ORAL_TABLET | Freq: Three times a day (TID) | ORAL | Status: DC

## 2016-06-01 MED ORDER — HYDRALAZINE HCL 25 MG PO TABS
25.0000 mg | ORAL_TABLET | Freq: Three times a day (TID) | ORAL | Status: DC
  Administered 2016-06-01: 25 mg via ORAL
  Filled 2016-06-01: qty 1

## 2016-06-01 MED ORDER — HYDRALAZINE HCL 10 MG PO TABS: 10.0000 mg | ORAL_TABLET | Freq: Three times a day (TID) | ORAL | Status: DC

## 2016-06-01 NOTE — Clinical Social Work Note (Signed)
CSW facilitated patient discharge including contacting patient family and facility to confirm patient discharge plans. Clinical information faxed to facility and family agreeable with plan. CSW arranged ambulance transport via [place] to [facility]. RN to call report prior to discharge [phone] Room 215).  CSW will sign off for now as social work intervention is no longer needed. Please consult [place] again if new needs arise.  Dayton Scrape, [place]

## 2016-06-01 NOTE — Care Management Note (Signed)
Case Management Note  Patient Details  Name: Raymond Navarro MRN: [id number] Date of Birth: 05-08-1929  Subjective/Objective:                    Action/Plan: Pt discharging to [facility] SNF today. No further needs per CM.   Expected Discharge Date:                  Expected Discharge Plan:  [place]  In-House Referral:     Discharge planning Services     Post Acute Care Choice:    Choice offered to:     DME Arranged:    DME Agency:     HH Arranged:    [place] Agency:     Status of Service:  Completed, signed off  If discussed at [employer] of Stay Meetings, dates discussed:    Additional Comments:  Pollie Friar, RN 06/01/2016, 10:39 AM

## 2016-06-01 NOTE — Discharge Summary (Signed)
Physician Discharge Summary  JOZEPH ROMER [id number] DOB: August 19, 1928 DOA: 05/27/2016  PCP: Rusty Aus, MD  Admit date: 05/27/2016 Discharge date: 06/01/2016  Admitted From: home Disposition:  SNF  Recommendations for Outpatient Follow-up:  1. Follow up with Drs. Botero and Gaylord as below 2. Stop Coumadin on discharge  Discharge Condition: stable CODE STATUS: DNR Diet recommendation: regular  HPI: per Dr. Tamala Cleofas, [name] is a 82 y.o. male with medical history significant of Parkinson's disease, chronic atrial fibrillation, on chronic anticoagulation therapy, s/p PM, and DM type II; who presents after having an unwitnessed fall. Family noting that the patient falls frequently around the home. Family was out shopping at the time were unable to get a hold of him by phone and therefore returned home. They found him on the ground by the table with blood all over his face. Patient has no memory of the fall. Complains of headache, pain of his neck, and because vision out of the left eye. Patient denies any shortness of breath, chest pain, nausea, vomiting, or abdominal pain. ED Course: Upon admission so was evaluated with CT scan of the brain which showed the left frontal hematoma, small subarachnoid hemorrhage, and fracture to the left orbit. Patient's INR was done. 3. Patient was given 10 mg of vitamin K and  Kcentra. Dr. Sherley Bounds of Neurosurgery  and Dr. Melissa Montane of trauma surgery work, consulted while the patient was in the ED. Neurosurgery recommended follow-up CT in a.m. Trauma surgery evaluated  the patient for his facial lacerations and left orbital blowout fracture. He received stitches and was recommended to follow-up in one week for reexamination in the office.   Hospital Course: Discharge Diagnoses:  Principal Problem:   Traumatic subarachnoid bleed with LOC of 30 minutes or less, initial encounter ([place]) Active Problems:   Pacemaker-[employer]   Atrial  fibrillation (HCC)   Facial laceration   Fracture of left orbital floor (HCC)   Diabetes mellitus type 2 in nonobese (HCC)   Parkinson's disease ([place])   Hyperlipidemia   Hypokalemia  Small sub arachnoid hemorrhage following an unwitnessed fall - Neurosurgery was consulted, patient was initially admitted to step down, and underwent a repeat CT scan the next day which showed slight increased in the posterior left frontal small hemorrhagic cortical contusion. Currently he is alert and oriented and no new complaints. Dr. Karleen Hampshire discussed with Dr Joya Salm on call neuro surgery regarding the above findings, recommended to follow up with neurosurgery office in one week with a repeat CT of the head without contrast. Stop Coumadin on discharge Chronic atrial fibrillation - Rate controlled. Off anticoagulation due to sub arachnoid hemorrhage. Dicussed the risks of anticoagulation vs benefits, pts' wife would like him to be off anti coagulation. This is something, they will have to discuss with their cardiologist as well Fracture of the left orbital floor - Pain control. Outpatient follow up with trauma surgery as recommended.  Diabetes mellitus - resume home medications Hypertension - Poorly controlled today, added hydralazine. Improved. Please check BP with appropriate cuff and manually to prevent erroneous high readings. Leukocytosis - Reactive, CXR and UA is negative for infection Pelvic ramus fracture on the left - Pain control and weight bearing as tolerated.    Discharge Instructions   Allergies as of 06/01/2016      Reactions   Niacin And Related Itching, Rash      Medication List    STOP taking these medications   warfarin 5 MG tablet  Commonly known as:  COUMADIN     TAKE these medications   acetaminophen 500 MG tablet Commonly known as:  TYLENOL Take 1,000 mg by mouth every 6 (six) hours as needed (pain).   alfuzosin 10 MG 24 hr tablet Commonly known as:  UROXATRAL Take 10 mg by  mouth daily.   amoxicillin 500 MG capsule Commonly known as:  AMOXIL Take 2,000 mg by mouth See admin instructions. Take 4 capsules (2000 mg) by mouth one hour before dental appointments   b complex vitamins tablet Take 1 tablet by mouth daily.   brimonidine 0.2 % ophthalmic solution Commonly known as:  ALPHAGAN Place 2 drops into both eyes 2 (two) times daily.   carbidopa-levodopa 25-100 MG tablet Commonly known as:  SINEMET IR Take 1 tablet by mouth 3 (three) times daily.   diltiazem 180 MG 24 hr capsule Commonly known as:  CARDIZEM CD Take 180 mg by mouth daily.   glimepiride 4 MG tablet Commonly known as:  AMARYL Take 4 mg by mouth 2 (two) times daily.   hydrALAZINE 10 MG tablet Commonly known as:  APRESOLINE Take 1 tablet (10 mg total) by mouth 3 (three) times daily.   metFORMIN 500 MG 24 hr tablet Commonly known as:  GLUCOPHAGE-XR Take 500 mg by mouth 2 (two) times daily.   pravastatin 20 MG tablet Commonly known as:  PRAVACHOL Take 20 mg by mouth at bedtime.   sertraline 50 MG tablet Commonly known as:  ZOLOFT Take 50 mg by mouth daily.   sitaGLIPtin 50 MG tablet Commonly known as:  JANUVIA Take 50 mg by mouth daily.   VITAMIN D PO Take 1 tablet by mouth daily.      Follow-up Information    Floyce Stakes, MD. Schedule an appointment as soon as possible for a visit in 1 week(s).   Specialty:  Neurosurgery Contact information: [address] [place] [address] [phone]          Allergies  Allergen Reactions  . Niacin And Related Itching and Rash    Consultations:  Neurosurgery   Trauma   Procedures/Studies:  Ct Head Wo Contrast  Result Date: 05/28/2016 CLINICAL DATA:  Recent fall, loss of consciousness, acute subarachnoid hemorrhage 05/27/2016 EXAM: CT HEAD WITHOUT CONTRAST TECHNIQUE: Contiguous axial images were obtained from the base of the skull through the vertex without intravenous contrast. COMPARISON:   05/27/2016 FINDINGS: Brain: Minimal increase in the small left posterior frontal hyperdense hemorrhagic contusion, image 22. , but best appreciated on the coronal reconstructions. Slight increase in the small amount of anterior right frontal all subarachnoid hemorrhage, image 23. Stable brain atrophy and extensive chronic white matter microvascular ischemic changes throughout both cerebral hemispheres. No significant mass effect, focal edema, midline shift, herniation, hydrocephalus, or extra-axial fluid collection. No interventricular hemorrhage. Cisterns remain patent. Cerebellar atrophy as well. Vascular: No hyperdense vessel or unexpected calcification. Skull: Negative for skull fracture. Mastoids remain clear. Left frontal orbital soft tissue hematoma again noted. Sinuses/Orbits: Orbits are intact and symmetric. Air-fluid level in the left maxillary sinus as before. Other sinuses remain clear. Other: None. IMPRESSION: Slight increased in the posterior left frontal small hemorrhagic cortical contusion. Slight increase in the small anterior right frontal subarachnoid hemorrhage Stable atrophy and chronic white matter microvascular changes Stable left frontal orbital/ preseptal soft tissue hematoma. Electronically Signed   By: Jerilynn Mages.  Shick M.D.   On: 05/28/2016 09:11   Ct Cervical Spine Wo Contrast  Result Date: 05/27/2016 CLINICAL DATA:  Patient found down today. Hematoma above the left eye. Initial encounter. EXAM: CT CERVICAL SPINE WITHOUT CONTRAST TECHNIQUE: Multidetector CT imaging of the cervical spine was performed without intravenous contrast. Multiplanar CT image reconstructions were also generated. COMPARISON:  None. FINDINGS: Alignment: Trace anterolisthesis C7 on T1 due to facet degenerative disease is noted. Alignment is otherwise maintained. Skull base and vertebrae: No acute fracture. No primary bone lesion or focal pathologic process. Soft tissues and spinal canal: No prevertebral fluid or  swelling. No visible canal hematoma. Disc levels: Mild loss of disc space height and endplate spurring are seen at C6-7. Multilevel facet arthropathy is seen. No obvious central canal or foraminal stenosis. Upper chest: Lung apices are clear. Other: None. IMPRESSION: No acute abnormality.  Mild degenerative disease noted. Electronically Signed   By: Inge Rise M.D.   On: 05/27/2016 21:12   Ct Pelvis Wo Contrast  Result Date: 05/28/2016 CLINICAL DATA:  Unwitnessed fall tonight.  Left hip pain. EXAM: CT PELVIS WITHOUT CONTRAST TECHNIQUE: Multidetector CT imaging of the pelvis was performed following the standard protocol without intravenous contrast. COMPARISON:  Radiographs 05/27/2016 FINDINGS: There are acute minimally displaced fractures of the inferior and superior left pubic rami. There is a nondisplaced left sacral ala fracture. Pubic symphysis is intact. Sacroiliac joints are intact. Left hip is intact. Right hip arthroplasties intact. There is a small extraperitoneal hemorrhage in the pelvis, associated with the pubic ramus fractures. Incidental findings include colonic diverticulosis and moderate atherosclerotic calcification of the iliac arteries. IMPRESSION: Acute fractures of the left pubic rami and left sacrum, nondisplaced to minimally displaced. Small extraperitoneal hemorrhage in the low left hemipelvis associated with the pubic ramus fractures. Hips are intact. Electronically Signed   By: Andreas Newport M.D.   On: 05/28/2016 01:55   Dg Pelvis Portable  Result Date: 05/27/2016 CLINICAL DATA:  Fall and pain.  Initial encounter. EXAM: PORTABLE PELVIS 1-2 VIEWS COMPARISON:  01/22/2013 FINDINGS: AP view of the pelvis. Right hip arthroplasty. No acute complication. Sacroiliac joints are symmetric. IMPRESSION: No acute osseous abnormality. Electronically Signed   By: Abigail Miyamoto M.D.   On: 05/27/2016 22:40   Dg Chest Port 1 View  Result Date: 05/28/2016 CLINICAL DATA:  82 year old  male with fall and shortness of breath. EXAM: PORTABLE CHEST 1 VIEW COMPARISON:  Chest radiograph dated 05/27/2016 FINDINGS: There is eventration of the right hemidiaphragm. The lungs are clear. There is no pleural effusion or pneumothorax. The cardiac silhouette is within normal limits. The aorta is tortuous. There is atherosclerotic calcification of the aorta. Left pectoral pacemaker device. No acute osseous pathology. IMPRESSION: No acute cardiopulmonary process. Electronically Signed   By: Anner Crete M.D.   On: 05/28/2016 04:23   Dg Chest Portable 1 View  Result Date: 05/27/2016 CLINICAL DATA:  Status post fall with rib and head pain. EXAM: PORTABLE CHEST 1 VIEW COMPARISON:  Chest radiograph 07/13/2012 FINDINGS: Single lead left chest wall pacemaker lead overlies the right ventricle. There is atherosclerotic calcification within the aortic arch. Cardiomediastinal size is otherwise normal. There are scattered bilateral interstitial opacities without overt pulmonary edema. No focal airspace consolidation. No pleural effusion or pneumothorax. No acute osseous abnormality. IMPRESSION: 1. No focal airspace consolidation. 2. Aortic atherosclerosis. 3. No rib fracture. Electronically Signed   By: Ulyses Jarred M.D.   On: 05/27/2016 22:43   Ct Head Code Stroke W/o Cm  Result Date: 05/27/2016 CLINICAL DATA:  Code stroke. 82 year old male found down. Diabetes. Parkinson's disease. Skin cancer. Initial encounter. EXAM: CT  HEAD WITHOUT CONTRAST TECHNIQUE: Contiguous axial images were obtained from the base of the skull through the vertex without intravenous contrast. COMPARISON:  01/20/2011 MR.  06/15/2010 CT. FINDINGS: Brain: Posterior left frontal lobe small hemorrhagic contusion measuring up to 8 mm. Small amount of subarachnoid blood anterior right frontal region. Prominent chronic microvascular changes without CT evidence of large acute infarct. Global atrophy without hydrocephalus. No intracranial mass  lesion noted on this unenhanced exam. Vascular: No hyperdense vessel.  Vascular calcifications. Skull: No skull fracture Sinuses/Orbits: Left preseptal moderately large hematoma. The underlying globe appears to be grossly intact however there is a left orbital floor fracture with slight displacement. No entrapment of the inferior rectus muscle. Other: Negative ASPECTS ([place] Stroke Program Early CT Score) - Ganglionic level infarction (caudate, lentiform nuclei, internal capsule, insula, M1-M3 cortex): 7 - Supraganglionic infarction (M4-M6 cortex): 3 Total score (0-10 with 10 being normal): 10 IMPRESSION: 1. Posterior left frontal lobe small hemorrhagic contusion measuring up to 8 mm. Small amount of subarachnoid blood anterior right frontal region. Prominent chronic microvascular changes without CT evidence of large acute infarct. Global atrophy without hydrocephalus. Left preseptal moderately large hematoma. The underlying globe appears to be grossly intact however there is a left orbital floor fracture with slight displacement. No entrapment of the inferior rectus muscle. 1. ASPECTS is 10 These results were called by telephone at the time of interpretation on 05/27/2016 at 8:54 Pm to Dr. Elnora Morrison , who verbally acknowledged these results. Upon initially calling Dr. Nicole Kindred, code stroke was canceled. Electronically Signed   By: Genia Del M.D.   On: 05/27/2016 21:07   Ct Maxillofacial Wo Contrast  Result Date: 05/27/2016 CLINICAL DATA:  Recent fall EXAM: CT MAXILLOFACIAL WITHOUT CONTRAST TECHNIQUE: Multidetector CT imaging of the maxillofacial structures was performed. Multiplanar CT image reconstructions were also generated. A small metallic BB was placed on the right temple in order to reliably differentiate right from left. COMPARISON:  None. FINDINGS: Osseous: Degenerative changes of the cervical spine are noted. Mild blowout fracture of the inferior orbital wall on the left is noted. No  muscular entrapment is noted although the inferior rectus muscle lies adjacent to the bony defect. Orbits: Postsurgical changes are noted in both globes. Sinuses: Mild mucosal changes are noted in the left maxillary antrum consistent with the adjacent blowout fracture. Soft tissues: Considerable soft tissue swelling is noted in the left periorbital region. No other soft tissue abnormality is noted. Limited intracranial: Atrophic changes are noted. IMPRESSION: Left inferior orbital wall blowout fracture with only mild displacement of the inferior fracture fragment. The left inferior rectus approaches the bony defect although does not appear entrapped. The muscle is somewhat edematous. Significant left periorbital soft tissue swelling. No other focal abnormality is noted. Electronically Signed   By: Inez Catalina M.D.   On: 05/27/2016 21:22     Subjective: - no chest pain, shortness of breath, no abdominal pain, nausea or vomiting.   Discharge Exam: Vitals:   06/01/16 0707 06/01/16 0836  BP: (!) 184/79 (!) 146/60  Pulse:    Resp:    Temp:     Vitals:   06/01/16 0121 06/01/16 0504 06/01/16 0707 06/01/16 0836  BP: (!) 161/73 (!) 182/84 (!) 184/79 (!) 146/60  Pulse: 91 93    Resp: 20 20    Temp: 98.7 F (37.1 C) 97.7 F (36.5 C)    TempSrc: Oral Oral    SpO2: 98% 95%    Weight:      Height:  General: Pt is alert, awake, not in acute distress Cardiovascular: RRR, S1/S2 +, no rubs, no gallops Respiratory: CTA bilaterally, no wheezing, no rhonchi    The results of significant diagnostics from this hospitalization (including imaging, microbiology, ancillary and laboratory) are listed below for reference.     Microbiology: Recent Results (from the past 240 hour(s))  MRSA PCR Screening     Status: None   Collection Time: 05/28/16  4:31 PM  Result Value Ref Range Status   MRSA by PCR NEGATIVE NEGATIVE Final    Comment:        The GeneXpert MRSA Assay (FDA approved for NASAL  specimens only), is one component of a comprehensive MRSA colonization surveillance program. It is not intended to diagnose MRSA infection nor to guide or monitor treatment for MRSA infections.      Labs: BNP (last 3 results) No results for input(s): BNP in the last 8760 hours. Basic Metabolic Panel:  Recent Labs Lab 05/27/16 2036 05/27/16 2048 05/28/16 0428 05/30/16 0412 05/31/16 0516  NA 132* 139 135 139 142  K 3.0* 3.3* 3.8 3.3* 3.8  CL 95* 99* 99* 109 110  CO2 27  --  23 22 26   GLUCOSE 212* 210* 363* 217* 230*  BUN 16 19 16 18 20   CREATININE 1.02 0.80 1.10 0.92 0.96  CALCIUM 8.7*  --  8.7* 8.1* 8.1*   Liver Function Tests:  Recent Labs Lab 05/27/16 2036  AST 29  ALT 7*  ALKPHOS 87  BILITOT 1.2  PROT 6.6  ALBUMIN 4.0   No results for input(s): LIPASE, AMYLASE in the last 168 hours. No results for input(s): AMMONIA in the last 168 hours. CBC:  Recent Labs Lab 05/27/16 2036 05/27/16 2048 05/28/16 0428 05/30/16 0412 05/31/16 0516  WBC 14.7*  --  14.9* 13.5* 13.1*  NEUTROABS 12.2*  --   --   --   --   HGB 14.4 15.0 13.9 11.6* 12.1*  HCT 42.6 44.0 42.6 35.8* 36.9*  MCV 88.0  --  90.4 92.5 90.2  PLT 171  --  174 133* 181   Cardiac Enzymes: No results for input(s): CKTOTAL, CKMB, CKMBINDEX, TROPONINI in the last 168 hours. BNP: Invalid input(s): POCBNP CBG:  Recent Labs Lab 05/31/16 0615 05/31/16 1132 05/31/16 1627 05/31/16 2105 06/01/16 0617  GLUCAP 204* 302* 140* 178* 244*   D-Dimer No results for input(s): DDIMER in the last 72 hours. Hgb A1c  Recent Labs  05/30/16 1829  HGBA1C 7.2*   Lipid Profile No results for input(s): CHOL, HDL, LDLCALC, TRIG, CHOLHDL, LDLDIRECT in the last 72 hours. Thyroid function studies No results for input(s): TSH, T4TOTAL, T3FREE, THYROIDAB in the last 72 hours.  Invalid input(s): FREET3 Anemia work up No results for input(s): VITAMINB12, FOLATE, FERRITIN, TIBC, IRON, RETICCTPCT in the last 72  hours. Urinalysis    Component Value Date/Time   COLORURINE YELLOW 05/27/2016 2322   APPEARANCEUR CLEAR 05/27/2016 2322   APPEARANCEUR Hazy 01/13/2013 1000   LABSPEC 1.013 05/27/2016 2322   LABSPEC 1.023 01/13/2013 1000   PHURINE 7.0 05/27/2016 2322   GLUCOSEU 150 (A) 05/27/2016 2322   GLUCOSEU Negative 01/13/2013 1000   HGBUR SMALL (A) 05/27/2016 2322   BILIRUBINUR NEGATIVE 05/27/2016 2322   BILIRUBINUR Negative 01/13/2013 1000   KETONESUR 20 (A) 05/27/2016 2322   PROTEINUR 100 (A) 05/27/2016 2322   NITRITE NEGATIVE 05/27/2016 2322   LEUKOCYTESUR NEGATIVE 05/27/2016 2322   LEUKOCYTESUR Negative 01/13/2013 1000   Sepsis Labs Invalid input(s): PROCALCITONIN,  WBC,  [place] [facility] Recent Results (from the past 240 hour(s))  MRSA PCR Screening     Status: None   Collection Time: 05/28/16  4:31 PM  Result Value Ref Range Status   MRSA by PCR NEGATIVE NEGATIVE Final    Comment:        The GeneXpert MRSA Assay (FDA approved for NASAL specimens only), is one component of a comprehensive MRSA colonization surveillance program. It is not intended to diagnose MRSA infection nor to guide or monitor treatment for MRSA infections.      Time coordinating discharge: Over 30 minutes  SIGNED:  Marzetta Board, MD  [facility] 06/01/2016, 9:57 AM Pager [phone]  If 7PM-7AM, please contact night-coverage [email or web address] Password [email or web address]

## 2016-06-02 DIAGNOSIS — N400 Enlarged prostate without lower urinary tract symptoms: Secondary | ICD-10-CM

## 2016-06-02 DIAGNOSIS — F39 Unspecified mood [affective] disorder: Secondary | ICD-10-CM

## 2016-06-02 DIAGNOSIS — G20 Parkinson's disease: Secondary | ICD-10-CM

## 2016-06-02 DIAGNOSIS — S066X0A Traumatic subarachnoid hemorrhage without loss of consciousness, initial encounter: Secondary | ICD-10-CM

## 2016-06-02 DIAGNOSIS — S329XXA Fracture of unspecified parts of lumbosacral spine and pelvis, initial encounter for closed fracture: Secondary | ICD-10-CM

## 2016-06-02 NOTE — Progress Notes (Signed)
Patient is picked up by [facility], report given to receiving nurse at [facility].

## 2016-06-03 ENCOUNTER — Emergency Department: Payer: Medicare Other

## 2016-06-03 ENCOUNTER — Encounter: Payer: Self-pay | Admitting: Emergency Medicine

## 2016-06-03 ENCOUNTER — Inpatient Hospital Stay
Admission: EM | Admit: 2016-06-03 | Discharge: 2016-06-08 | DRG: 177 | Disposition: A | Discharge status: Hospice | Payer: Medicare Other | Attending: Internal Medicine | Admitting: Internal Medicine

## 2016-06-03 DIAGNOSIS — E870 Hyperosmolality and hypernatremia: Secondary | ICD-10-CM | POA: Diagnosis present

## 2016-06-03 DIAGNOSIS — J9601 Acute respiratory failure with hypoxia: Secondary | ICD-10-CM

## 2016-06-03 DIAGNOSIS — Z7984 Long term (current) use of oral hypoglycemic drugs: Secondary | ICD-10-CM | POA: Diagnosis not present

## 2016-06-03 DIAGNOSIS — G20 Parkinson's disease: Secondary | ICD-10-CM | POA: Diagnosis present

## 2016-06-03 DIAGNOSIS — Z974 Presence of external hearing-aid: Secondary | ICD-10-CM

## 2016-06-03 DIAGNOSIS — R4182 Altered mental status, unspecified: Secondary | ICD-10-CM | POA: Diagnosis present

## 2016-06-03 DIAGNOSIS — Z85828 Personal history of other malignant neoplasm of skin: Secondary | ICD-10-CM

## 2016-06-03 DIAGNOSIS — I482 Chronic atrial fibrillation: Secondary | ICD-10-CM | POA: Diagnosis present

## 2016-06-03 DIAGNOSIS — S0012XA Contusion of left eyelid and periocular area, initial encounter: Secondary | ICD-10-CM | POA: Diagnosis present

## 2016-06-03 DIAGNOSIS — Z888 Allergy status to other drugs, medicaments and biological substances status: Secondary | ICD-10-CM | POA: Diagnosis not present

## 2016-06-03 DIAGNOSIS — G9340 Encephalopathy, unspecified: Secondary | ICD-10-CM

## 2016-06-03 DIAGNOSIS — Y95 Nosocomial condition: Secondary | ICD-10-CM

## 2016-06-03 DIAGNOSIS — Z950 Presence of cardiac pacemaker: Secondary | ICD-10-CM | POA: Diagnosis not present

## 2016-06-03 DIAGNOSIS — E860 Dehydration: Secondary | ICD-10-CM | POA: Diagnosis present

## 2016-06-03 DIAGNOSIS — N1330 Unspecified hydronephrosis: Secondary | ICD-10-CM | POA: Diagnosis present

## 2016-06-03 DIAGNOSIS — H409 Unspecified glaucoma: Secondary | ICD-10-CM | POA: Diagnosis present

## 2016-06-03 DIAGNOSIS — Z66 Do not resuscitate: Secondary | ICD-10-CM | POA: Diagnosis present

## 2016-06-03 DIAGNOSIS — N179 Acute kidney failure, unspecified: Secondary | ICD-10-CM

## 2016-06-03 DIAGNOSIS — E785 Hyperlipidemia, unspecified: Secondary | ICD-10-CM | POA: Diagnosis present

## 2016-06-03 DIAGNOSIS — N170 Acute kidney failure with tubular necrosis: Secondary | ICD-10-CM | POA: Diagnosis present

## 2016-06-03 DIAGNOSIS — E1165 Type 2 diabetes mellitus with hyperglycemia: Secondary | ICD-10-CM | POA: Diagnosis present

## 2016-06-03 DIAGNOSIS — J189 Pneumonia, unspecified organism: Secondary | ICD-10-CM

## 2016-06-03 DIAGNOSIS — Z515 Encounter for palliative care: Secondary | ICD-10-CM | POA: Diagnosis present

## 2016-06-03 DIAGNOSIS — I10 Essential (primary) hypertension: Secondary | ICD-10-CM | POA: Diagnosis present

## 2016-06-03 DIAGNOSIS — G9341 Metabolic encephalopathy: Secondary | ICD-10-CM | POA: Diagnosis present

## 2016-06-03 DIAGNOSIS — R0902 Hypoxemia: Secondary | ICD-10-CM

## 2016-06-03 DIAGNOSIS — J690 Pneumonitis due to inhalation of food and vomit: Secondary | ICD-10-CM | POA: Diagnosis present

## 2016-06-03 DIAGNOSIS — Z79899 Other long term (current) drug therapy: Secondary | ICD-10-CM | POA: Diagnosis not present

## 2016-06-03 LAB — URINALYSIS, ROUTINE W REFLEX MICROSCOPIC
Bilirubin Urine: NEGATIVE
GLUCOSE, UA: 150 mg/dL — AB
Ketones, ur: NEGATIVE mg/dL
Leukocytes, UA: NEGATIVE
Nitrite: NEGATIVE
PH: 5 (ref 5.0–8.0)
PROTEIN: NEGATIVE mg/dL
SPECIFIC GRAVITY, URINE: 1.012 (ref 1.005–1.030)

## 2016-06-03 LAB — CBC WITH DIFFERENTIAL/PLATELET
BASOS ABS: 0 10*3/uL (ref 0–0.1)
Basophils Relative: 0 %
Eosinophils Absolute: 0 10*3/uL (ref 0–0.7)
Eosinophils Relative: 0 %
HEMATOCRIT: 34.5 % — AB (ref 40.0–52.0)
HEMOGLOBIN: 11.4 g/dL — AB (ref 13.0–18.0)
LYMPHS PCT: 2 %
Lymphs Abs: 0.4 10*3/uL — ABNORMAL LOW (ref 1.0–3.6)
MCH: 30.3 pg (ref 26.0–34.0)
MCHC: 33.1 g/dL (ref 32.0–36.0)
MCV: 91.6 fL (ref 80.0–100.0)
MONO ABS: 1.1 10*3/uL — AB (ref 0.2–1.0)
MONOS PCT: 6 %
NEUTROS ABS: 18.2 10*3/uL — AB (ref 1.4–6.5)
NEUTROS PCT: 92 %
Platelets: 185 10*3/uL (ref 150–440)
RBC: 3.77 MIL/uL — ABNORMAL LOW (ref 4.40–5.90)
RDW: 14 % (ref 11.5–14.5)
WBC: 19.8 10*3/uL — ABNORMAL HIGH (ref 3.8–10.6)

## 2016-06-03 LAB — COMPREHENSIVE METABOLIC PANEL
ALK PHOS: 80 U/L (ref 38–126)
AST: 16 U/L (ref 15–41)
Albumin: 2.5 g/dL — ABNORMAL LOW (ref 3.5–5.0)
Anion gap: 10 (ref 5–15)
BUN: 55 mg/dL — AB (ref 6–20)
CALCIUM: 7.9 mg/dL — AB (ref 8.9–10.3)
CHLORIDE: 109 mmol/L (ref 101–111)
CO2: 23 mmol/L (ref 22–32)
CREATININE: 2.13 mg/dL — AB (ref 0.61–1.24)
GFR calc non Af Amer: 27 mL/min — ABNORMAL LOW (ref >60)
GFR, EST AFRICAN AMERICAN: 31 mL/min — AB (ref >60)
Glucose, Bld: 508 mg/dL (ref 65–99)
Potassium: 4.1 mmol/L (ref 3.5–5.1)
SODIUM: 142 mmol/L (ref 135–145)
Total Bilirubin: 1.4 mg/dL — ABNORMAL HIGH (ref 0.3–1.2)
Total Protein: 5.5 g/dL — ABNORMAL LOW (ref 6.5–8.1)

## 2016-06-03 LAB — BLOOD GAS, VENOUS
ACID-BASE DEFICIT: 2 mmol/L (ref 0.0–2.0)
Bicarbonate: 23.1 mmol/L (ref 20.0–28.0)
O2 SAT: 91 %
PATIENT TEMPERATURE: 37
PCO2 VEN: 40 mmHg — AB (ref 44.0–60.0)
pH, Ven: 7.37 (ref 7.250–7.430)
pO2, Ven: 63 mmHg — ABNORMAL HIGH (ref 32.0–45.0)

## 2016-06-03 LAB — PROTIME-INR
INR: 1.38
Prothrombin Time: 17.1 seconds — ABNORMAL HIGH (ref 11.4–15.2)

## 2016-06-03 MED ORDER — SODIUM CHLORIDE 0.9 % IV BOLUS (SEPSIS)
500.0000 mL | Freq: Once | INTRAVENOUS | Status: AC
  Administered 2016-06-03: 500 mL via INTRAVENOUS

## 2016-06-03 MED ORDER — ACETAMINOPHEN 650 MG RE SUPP: 650.0000 mg | Freq: Four times a day (QID) | RECTAL | Status: DC | PRN

## 2016-06-03 MED ORDER — INSULIN ASPART 100 UNIT/ML ~~LOC~~ SOLN
5.0000 [IU] | Freq: Once | SUBCUTANEOUS | Status: AC
  Administered 2016-06-03: 5 [IU] via INTRAVENOUS
  Filled 2016-06-03: qty 5

## 2016-06-03 MED ORDER — PIPERACILLIN-TAZOBACTAM 3.375 G IVPB
3.3750 g | Freq: Three times a day (TID) | INTRAVENOUS | Status: DC
  Administered 2016-06-03 – 2016-06-07 (×11): 3.375 g via INTRAVENOUS
  Filled 2016-06-03 (×11): qty 50

## 2016-06-03 MED ORDER — SODIUM CHLORIDE 0.9 % IV SOLN
INTRAVENOUS | Status: DC
  Administered 2016-06-03: 21:00:00 via INTRAVENOUS

## 2016-06-03 MED ORDER — ONDANSETRON HCL 4 MG PO TABS: 4.0000 mg | ORAL_TABLET | Freq: Four times a day (QID) | ORAL | Status: DC | PRN

## 2016-06-03 MED ORDER — ALFUZOSIN HCL ER 10 MG PO TB24
10.0000 mg | ORAL_TABLET | Freq: Every day | ORAL | Status: DC
  Administered 2016-06-04 – 2016-06-05 (×2): 10 mg via ORAL
  Filled 2016-06-03 (×4): qty 1

## 2016-06-03 MED ORDER — ACETAMINOPHEN 325 MG PO TABS: 650.0000 mg | ORAL_TABLET | Freq: Four times a day (QID) | ORAL | Status: DC | PRN

## 2016-06-03 MED ORDER — PRAVASTATIN SODIUM 20 MG PO TABS
20.0000 mg | ORAL_TABLET | Freq: Every day | ORAL | Status: DC
  Administered 2016-06-03 – 2016-06-04 (×2): 20 mg via ORAL
  Filled 2016-06-03 (×2): qty 1

## 2016-06-03 MED ORDER — CHOLECALCIFEROL 10 MCG (400 UNIT) PO TABS
400.0000 [IU] | ORAL_TABLET | Freq: Every day | ORAL | Status: DC
  Administered 2016-06-04 – 2016-06-05 (×2): 400 [IU] via ORAL
  Filled 2016-06-03 (×2): qty 1

## 2016-06-03 MED ORDER — B COMPLEX-C PO TABS
1.0000 | ORAL_TABLET | Freq: Every day | ORAL | Status: DC
  Administered 2016-06-04 – 2016-06-05 (×2): 1 via ORAL
  Filled 2016-06-03 (×4): qty 1

## 2016-06-03 MED ORDER — ALBUTEROL SULFATE (2.5 MG/3ML) 0.083% IN NEBU
5.0000 mg | INHALATION_SOLUTION | Freq: Once | RESPIRATORY_TRACT | Status: AC
  Administered 2016-06-03: 5 mg via RESPIRATORY_TRACT
  Filled 2016-06-03: qty 6

## 2016-06-03 MED ORDER — HYDRALAZINE HCL 10 MG PO TABS
10.0000 mg | ORAL_TABLET | Freq: Three times a day (TID) | ORAL | Status: DC
  Administered 2016-06-03 – 2016-06-05 (×4): 10 mg via ORAL
  Filled 2016-06-03 (×6): qty 1

## 2016-06-03 MED ORDER — IPRATROPIUM-ALBUTEROL 0.5-2.5 (3) MG/3ML IN SOLN
3.0000 mL | Freq: Once | RESPIRATORY_TRACT | Status: AC
  Administered 2016-06-03: 3 mL via RESPIRATORY_TRACT
  Filled 2016-06-03: qty 3

## 2016-06-03 MED ORDER — VANCOMYCIN HCL IN DEXTROSE 1-5 GM/200ML-% IV SOLN
1000.0000 mg | Freq: Once | INTRAVENOUS | Status: AC
  Administered 2016-06-03: 1000 mg via INTRAVENOUS
  Filled 2016-06-03: qty 200

## 2016-06-03 MED ORDER — BRIMONIDINE TARTRATE 0.2 % OP SOLN
2.0000 [drp] | Freq: Two times a day (BID) | OPHTHALMIC | Status: DC
  Administered 2016-06-04 – 2016-06-07 (×7): 2 [drp] via OPHTHALMIC
  Filled 2016-06-03: qty 5

## 2016-06-03 MED ORDER — DILTIAZEM HCL ER COATED BEADS 180 MG PO CP24
180.0000 mg | ORAL_CAPSULE | Freq: Every day | ORAL | Status: DC
  Administered 2016-06-04 – 2016-06-05 (×2): 180 mg via ORAL
  Filled 2016-06-03 (×2): qty 1

## 2016-06-03 MED ORDER — CARBIDOPA-LEVODOPA 25-100 MG PO TABS
1.0000 | ORAL_TABLET | Freq: Three times a day (TID) | ORAL | Status: DC
  Administered 2016-06-03 – 2016-06-05 (×6): 1 via ORAL
  Filled 2016-06-03 (×8): qty 1

## 2016-06-03 MED ORDER — SERTRALINE HCL 50 MG PO TABS
50.0000 mg | ORAL_TABLET | Freq: Every day | ORAL | Status: DC
  Administered 2016-06-04 – 2016-06-05 (×2): 50 mg via ORAL
  Filled 2016-06-03 (×2): qty 1

## 2016-06-03 MED ORDER — ONDANSETRON HCL 4 MG/2ML IJ SOLN: 4.0000 mg | Freq: Four times a day (QID) | INTRAMUSCULAR | Status: DC | PRN

## 2016-06-03 MED ORDER — SODIUM CHLORIDE 0.9 % IV BOLUS (SEPSIS)
1000.0000 mL | Freq: Once | INTRAVENOUS | Status: AC
  Administered 2016-06-03: 1000 mL via INTRAVENOUS

## 2016-06-03 MED ORDER — CEFEPIME-DEXTROSE 2 GM/50ML IV SOLR
2.0000 g | Freq: Once | INTRAVENOUS | Status: AC
  Administered 2016-06-03: 2 g via INTRAVENOUS
  Filled 2016-06-03: qty 50

## 2016-06-03 NOTE — ED Triage Notes (Signed)
Per [facility]: Pt. To [facility] ED from [facility] d/t mental status change from new baseline. Facility concerned of aspiration. Started norco yesterday

## 2016-06-03 NOTE — ED Notes (Addendum)
Blood Sugar 508 called to this RN by lab

## 2016-06-03 NOTE — H&P (Signed)
[facility] NAME: Raymond Navarro    MR#:  [id number]  DATE OF BIRTH:  Aug 22, 1928  DATE OF ADMISSION:  06/03/2016  PRIMARY CARE PHYSICIAN: Rusty Aus, MD   REQUESTING/REFERRING PHYSICIAN: Dr. Merlyn Lot  CHIEF COMPLAINT:   Chief Complaint  Patient presents with  . Altered Mental Status    HISTORY OF PRESENT ILLNESS:  Raymond Navarro  is a 82 y.o. male with a known history of a. Fib, hx of chronic anemia, HTN, Hyperlipidemia, hx of Parkinson's disease, hx of Permanent pacemaker, DM type II, recent fall s/p Subarachnoid hemorrhage who was just discharged from [facility] to [facility], Who presents to the hospital due to altered mental status and also noted  to be hypoxic.  Patient as per the family is more confused than usual. Since he was discharged from [facility] after his subarachnoid hemorrhage his oral intake has been poor and he has been more lethargic. There has been no seizure type activity. Has been no fever, nausea, vomiting or any other associated symptoms. Patient presented to the emergency room and was noted to be in acute renal failure.  Hospitalist services were contacted for further treatment and evaluation.   PAST MEDICAL HISTORY:   Past Medical History:  Diagnosis Date  . Anemia   . Atrial fibrillation, chronic ([place])   . Cancer (HCC)    hx of skin cancer  . Colitis    Hx of  . Diabetes mellitus    type 2  . Dysrhythmia    ATRIAL FIBRILATION  . History of chicken pox   . Hyperlipidemia   . Hypertension   . Incontinence of feces   . Parkinson disease ([place])   . Presence of permanent cardiac pacemaker 01/11/12   Medtronic [place] Single chamber, Serial # [id number]  . Vitamin D deficiency   . Wears hearing aid    bilateral    PAST SURGICAL HISTORY:   Past Surgical History:  Procedure Laterality Date  . APPENDECTOMY    . CHOLECYSTECTOMY  1972  . COLONOSCOPY WITH PROPOFOL N/A  12/02/2015   Procedure: COLONOSCOPY WITH PROPOFOL;  Surgeon: Lollie Sails, MD;  Location: [facility] ENDOSCOPY;  Service: Endoscopy;  Laterality: N/A;  . COLONOSCOPY WITH PROPOFOL N/A 12/06/2015   Procedure: COLONOSCOPY WITH PROPOFOL;  Surgeon: Lollie Sails, MD;  Location: [facility] ENDOSCOPY;  Service: Endoscopy;  Laterality: N/A;  . FRACTURE SURGERY    . hip surgery    . PERMANENT PACEMAKER INSERTION N/A 01/11/2012   Procedure: PERMANENT PACEMAKER INSERTION;  Surgeon: Evans Lance, MD;  Location: [facility] CATH LAB;  Service: Cardiovascular;  Laterality: N/A;  . TONSILLECTOMY  1972  . VASECTOMY  1980    SOCIAL HISTORY:   Social History  Substance Use Topics  . Smoking status: Never Smoker  . Smokeless tobacco: Never Used  . Alcohol use No     Comment: occasional    FAMILY HISTORY:   Family History  Problem Relation Age of Onset  . Heart disease Mother   . Cervical cancer Father     DRUG ALLERGIES:   Allergies  Allergen Reactions  . Niacin And Related Itching and Rash    REVIEW OF SYSTEMS:   Review of Systems  Unable to perform ROS: Mental acuity    MEDICATIONS AT HOME:   Prior to Admission medications   Medication Sig Start Date End Date Taking? Authorizing Provider  alfuzosin (UROXATRAL) 10 MG 24 hr  tablet Take 10 mg by mouth daily. 05/20/16  Yes Historical Provider, MD  b complex vitamins tablet Take 1 tablet by mouth daily.   Yes Historical Provider, MD  brimonidine (ALPHAGAN) 0.2 % ophthalmic solution Place 2 drops into both eyes 2 (two) times daily. 05/20/16  Yes Historical Provider, MD  carbidopa-levodopa (SINEMET IR) 25-100 MG tablet Take 1 tablet by mouth 3 (three) times daily.   Yes Historical Provider, MD  Cholecalciferol (VITAMIN D PO) Take 1 tablet by mouth daily.   Yes Historical Provider, MD  diltiazem (CARDIZEM CD) 180 MG 24 hr capsule Take 180 mg by mouth daily. 05/20/16  Yes Historical Provider, MD  glimepiride (AMARYL) 4 MG tablet Take 4 mg by mouth 2  (two) times daily.    Yes Historical Provider, MD  hydrALAZINE (APRESOLINE) 10 MG tablet Take 1 tablet (10 mg total) by mouth 3 (three) times daily. 06/01/16  Yes Costin Karlyne Greenspan, MD  metFORMIN (GLUCOPHAGE-XR) 500 MG 24 hr tablet Take 500 mg by mouth 2 (two) times daily.   Yes Historical Provider, MD  pravastatin (PRAVACHOL) 20 MG tablet Take 20 mg by mouth at bedtime.    Yes Historical Provider, MD  sertraline (ZOLOFT) 50 MG tablet Take 50 mg by mouth daily. 05/22/16  Yes Historical Provider, MD  sitaGLIPtin (JANUVIA) 50 MG tablet Take 50 mg by mouth daily.   Yes Historical Provider, MD  acetaminophen (TYLENOL) 500 MG tablet Take 1,000 mg by mouth every 6 (six) hours as needed (pain).    Historical Provider, MD  amoxicillin (AMOXIL) 500 MG capsule Take 2,000 mg by mouth See admin instructions. Take 4 capsules (2000 mg) by mouth one hour before dental appointments    Historical Provider, MD      VITAL SIGNS:  Blood pressure (!) 129/55, pulse 91, temperature 97.2 F (36.2 C), temperature source Axillary, resp. rate 20, SpO2 95 %.  PHYSICAL EXAMINATION:  Physical Exam  GENERAL:  82 y.o.-year-old patient lying in the bed in no acute distress.  EYES: Pupils equal, round, reactive to light and accommodation. No scleral icterus. Extraocular muscles intact.  HEENT: Head atraumatic, normocephalic. Oropharynx and nasopharynx clear. No oropharyngeal erythema, moist oral mucosa  NECK:  Supple, no jugular venous distention. No thyroid enlargement, no tenderness.  LUNGS: Normal breath sounds bilaterally, no wheezing, rales, rhonchi. No use of accessory muscles of respiration.  CARDIOVASCULAR: S1, S2 RRR. No murmurs, rubs, gallops, clicks.  ABDOMEN: Soft, nontender, nondistended. Bowel sounds present. No organomegaly or mass.  EXTREMITIES: No pedal edema, cyanosis, or clubbing. + 2 pedal & radial pulses b/l.   NEUROLOGIC: Cranial nerves II through XII are intact. No focal Motor or sensory deficits  appreciated b/l. Globally weak.  PSYCHIATRIC: The patient is alert and oriented x 1.  SKIN: No obvious rash, lesion, or ulcer.   LABORATORY PANEL:   CBC  Recent Labs Lab 06/03/16 1328  WBC 19.8*  HGB 11.4*  HCT 34.5*  PLT 185   ------------------------------------------------------------------------------------------------------------------  Chemistries   Recent Labs Lab 06/03/16 1320  NA 142  K 4.1  CL 109  CO2 23  GLUCOSE 508*  BUN 55*  CREATININE 2.13*  CALCIUM 7.9*  AST 16  ALT <5*  ALKPHOS 80  BILITOT 1.4*   ------------------------------------------------------------------------------------------------------------------  Cardiac Enzymes No results for input(s): TROPONINI in the last 168 hours. ------------------------------------------------------------------------------------------------------------------  RADIOLOGY:  Dg Chest 2 View  Result Date: 06/03/2016 CLINICAL DATA:  Unresponsive patient. Recently discharged after having a traumatic subarachnoid bleed. EXAM: CHEST  2 VIEW  COMPARISON:  05/28/2016 FINDINGS: Cardiac silhouette is normal in size. No mediastinal or hilar masses. Stable elevation the right hemidiaphragm. There are prominent bronchovascular markings and mild apical lung scarring, stable from the prior study. No evidence of pneumonia or pulmonary edema. No convincing pleural effusion or pneumothorax. Left anterior chest wall single lead pacemaker is stable. Skeletal structures are demineralized but grossly intact. IMPRESSION: No acute cardiopulmonary disease. Electronically Signed   By: Lajean Manes M.D.   On: 06/03/2016 13:48   Ct Head Wo Contrast  Result Date: 06/03/2016 CLINICAL DATA:  Unresponsive this am, s/p fall last week with bleed EXAM: CT HEAD WITHOUT CONTRAST TECHNIQUE: Contiguous axial images were obtained from the base of the skull through the vertex without intravenous contrast. COMPARISON:  05/28/2016 FINDINGS: Brain: There is  some residual hyperattenuation along the anterolateral left parietal lobe and right frontal lobe consistent with the expected evolution of the previously seen parenchymal hemorrhages. There is no new intracranial hemorrhage. The ventricles are normal in size, for the patient's age, and normal in configuration. There are no parenchymal masses or mass effect. There is no evidence of a recent cortical infarct. Patchy white matter hypoattenuation is noted consistent with moderate chronic microvascular ischemic change. There are no extra-axial masses or abnormal fluid collections. Vascular: No hyperdense vessel or unexpected calcification. Skull: Normal. Negative for fracture or focal lesion. Sinuses/Orbits: Globes and orbits are unremarkable. Visualized sinuses and mastoid air cells are clear. Other: None. IMPRESSION: 1. Small areas of parenchymal hemorrhage seen on the prior exam have evolved, being somewhat less focal dense. 2. There is no new intracranial hemorrhage. 3. There is no evidence of an infarct or hydrocephalus. No other change from the prior study. Electronically Signed   By: Lajean Manes M.D.   On: 06/03/2016 15:24     IMPRESSION AND PLAN:   82 year old male with past medical history of Parkinson's disease, hypertension, hyperlipidemia, diabetes, chronic atrial fibrillation, status post recent mechanical fall with a subarachnoid hemorrhage who presents to the hospital due to altered mental status and noted to be hypoxic.  1. Acute respiratory failure with hypoxia-etiology unclear but suspected to be secondary to aspiration pneumonia/pneumonitis. -Chest x-ray is negative for any acute pathology. -Continue O2 supplementation, I will treat the patient with empirically with IV Zosyn. We'll get speech evaluation.  2. Aspiration pneumonia-chest x-ray negative although. Empirically treat with Zosyn. Follow clinically. Get speech evaluation.  3. Acute kidney injury-secondary to dehydration. -I  will hydrate with IV fluids, follow BUN and creatinine. Renal dose meds. Avoid nephrotoxins.  4. Altered mental status-metabolic encephalopathy secondary to pneumonia/acute kidney injury. -Patient did have a recent subarachnoid hemorrhage with CT scan admitted in the ER shows improvement in the evolving hemorrhage. -Continue antibiotics for pneumonia, treat underlying acute kidney injury and follow mental status. 5.  5. History of Parkinson's disease-continue Sinemet.  6. History of glaucoma-continue Alphagan eyedrops.  7. Diabetes-hold metformin, glimepiride, Januvia. Place on sliding scale insulin.  8. Hyperlipidemia-continue Pravachol.    All the records are reviewed and case discussed with ED provider. Management plans discussed with the patient, family and they are in agreement.  CODE STATUS: DO NOT RESUSCITATE   TOTAL TIME TAKING CARE OF THIS PATIENT: 45 minutes.    Henreitta Leber M.D on 06/03/2016 at 5:22 PM  Between 7am to 6pm - Pager - [phone]  After 6pm go to [email or web address] - password [email or web address] [facility]  Office  [phone]  CC: Primary care physician; Rusty Aus, MD

## 2016-06-03 NOTE — ED Provider Notes (Signed)
[facility] Emergency Department Provider Note    First MD Initiated Contact with Patient 06/03/16 1319     (approximate)  I have reviewed the triage vital signs and the nursing notes.   HISTORY  Chief Complaint Altered Mental Status  Level V Caveat:  Metabolic enecphalopathy  HPI Raymond Navarro is a 82 y.o. male with the recent admission after fall from standing with a traumatic subarachnoid hemorrhage on Coumadin presents one day after discharge [facility] with altered mental status. There is no report of interval falls. He was reportedly altered with concern for aspiration event with hypoxia to the low 80s requiring supplemental O2. Patient does not wear oxygen at home. EMS recommended the patient be transferred to [facility] but family refused this for requesting he be evaluated at [facility] her facility. He arrives altered but following commands. Reportedly is a DO NOT RESUSCITATE though he does not arrive with any paperwork.   Past Medical History:  Diagnosis Date  . Anemia   . Atrial fibrillation, chronic ([place])   . Cancer (HCC)    hx of skin cancer  . Colitis    Hx of  . Diabetes mellitus    type 2  . Dysrhythmia    ATRIAL FIBRILATION  . History of chicken pox   . Hyperlipidemia   . Hypertension   . Incontinence of feces   . Parkinson disease ([place])   . Presence of permanent cardiac pacemaker 01/11/12   Medtronic [place] Single chamber, Serial # [id number]  . Vitamin D deficiency   . Wears hearing aid    bilateral   Family History  Problem Relation Age of Onset  . Family history unknown: Yes   Past Surgical History:  Procedure Laterality Date  . APPENDECTOMY    . CHOLECYSTECTOMY  1972  . COLONOSCOPY WITH PROPOFOL N/A 12/02/2015   Procedure: COLONOSCOPY WITH PROPOFOL;  Surgeon: Lollie Sails, MD;  Location: [facility] ENDOSCOPY;  Service: Endoscopy;  Laterality: N/A;  . COLONOSCOPY WITH PROPOFOL N/A 12/06/2015   Procedure:  COLONOSCOPY WITH PROPOFOL;  Surgeon: Lollie Sails, MD;  Location: [facility] ENDOSCOPY;  Service: Endoscopy;  Laterality: N/A;  . FRACTURE SURGERY    . hip surgery    . PERMANENT PACEMAKER INSERTION N/A 01/11/2012   Procedure: PERMANENT PACEMAKER INSERTION;  Surgeon: Evans Lance, MD;  Location: [facility] CATH LAB;  Service: Cardiovascular;  Laterality: N/A;  . TONSILLECTOMY  1972  . VASECTOMY  1980   Patient Active Problem List   Diagnosis Date Noted  . Traumatic subarachnoid bleed with LOC of 30 minutes or less, initial encounter ([place]) 05/28/2016  . Facial laceration 05/28/2016  . Fracture of left orbital floor ([place]) 05/28/2016  . Diabetes mellitus type 2 in nonobese ([place]) 05/28/2016  . Parkinson's disease ([place]) 05/28/2016  . Hyperlipidemia 05/28/2016  . Hypokalemia 05/28/2016  . Preop cardiovascular exam 01/09/2013  . Atrial fibrillation ([place]) 04/10/2012  . Pacemaker-Medtronic 01/12/2012      Prior to Admission medications   Medication Sig Start Date End Date Taking? Authorizing Provider  acetaminophen (TYLENOL) 500 MG tablet Take 1,000 mg by mouth every 6 (six) hours as needed (pain).    Historical Provider, MD  alfuzosin (UROXATRAL) 10 MG 24 hr tablet Take 10 mg by mouth daily. 05/20/16   Historical Provider, MD  amoxicillin (AMOXIL) 500 MG capsule Take 2,000 mg by mouth See admin instructions. Take 4 capsules (2000 mg) by mouth one hour before dental appointments  Historical Provider, MD  b complex vitamins tablet Take 1 tablet by mouth daily.    Historical Provider, MD  brimonidine (ALPHAGAN) 0.2 % ophthalmic solution Place 2 drops into both eyes 2 (two) times daily. 05/20/16   Historical Provider, MD  carbidopa-levodopa (SINEMET IR) 25-100 MG tablet Take 1 tablet by mouth 3 (three) times daily.    Historical Provider, MD  Cholecalciferol (VITAMIN D PO) Take 1 tablet by mouth daily.    Historical Provider, MD  diltiazem (CARDIZEM CD) 180 MG 24 hr capsule Take 180 mg by mouth daily.  05/20/16   Historical Provider, MD  glimepiride (AMARYL) 4 MG tablet Take 4 mg by mouth 2 (two) times daily.     Historical Provider, MD  hydrALAZINE (APRESOLINE) 10 MG tablet Take 1 tablet (10 mg total) by mouth 3 (three) times daily. 06/01/16   Costin Karlyne Greenspan, MD  metFORMIN (GLUCOPHAGE-XR) 500 MG 24 hr tablet Take 500 mg by mouth 2 (two) times daily.    Historical Provider, MD  pravastatin (PRAVACHOL) 20 MG tablet Take 20 mg by mouth at bedtime.     Historical Provider, MD  sertraline (ZOLOFT) 50 MG tablet Take 50 mg by mouth daily. 05/22/16   Historical Provider, MD  sitaGLIPtin (JANUVIA) 50 MG tablet Take 50 mg by mouth daily.    Historical Provider, MD    Allergies Niacin and related    Social History Social History  Substance Use Topics  . Smoking status: Never Smoker  . Smokeless tobacco: Never Used  . Alcohol use No     Comment: occasional    Review of Systems Patient denies headaches, rhinorrhea, blurry vision, numbness, shortness of breath, chest pain, edema, cough, abdominal pain, nausea, vomiting, diarrhea, dysuria, fevers, rashes or hallucinations unless otherwise stated above in HPI. ____________________________________________   PHYSICAL EXAM:  VITAL SIGNS: Vitals:   06/03/16 1514 06/03/16 1530  BP:  (!) 129/55  Pulse:  91  Resp:  20  Temp: 97.2 F (36.2 C)     Constitutional: Alert,  Ill appearing  Eyes: Left eye with conjunctival injection no proptosis PERRL. EOMI. Head: Left facial bruising and ecchymosis Nose: No congestion/rhinnorhea. Mouth/Throat: Mucous membranes are dry.  Oropharynx non-erythematous. Neck: No stridor. Painless ROM. No cervical spine tenderness to palpation Hematological/Lymphatic/Immunilogical: No cervical lymphadenopathy. Cardiovascular: Normal rate, regular rhythm. Grossly normal heart sounds.  Good peripheral circulation. Respiratory: Normal respiratory effort.  No retractions. Lungs with coarse bibasilar breath sounds,  right lung field rhonchi Gastrointestinal: Soft and nontender. No distention. No abdominal bruits. No CVA tenderness. Musculoskeletal: No lower extremity tenderness nor edema.  No joint effusions. Neurologic:  Patient is encephalopathic but following simple commands. No facial droop  ____________________________________________   LABS (all labs ordered are listed, but only abnormal results are displayed)  Results for orders placed or performed during the hospital encounter of 06/03/16 (from the past 24 hour(s))  Comprehensive metabolic panel     Status: Abnormal   Collection Time: 06/03/16  1:20 PM  Result Value Ref Range   Sodium 142 135 - 145 mmol/L   Potassium 4.1 3.5 - 5.1 mmol/L   Chloride 109 101 - 111 mmol/L   CO2 23 22 - 32 mmol/L   Glucose, Bld 508 (HH) 65 - 99 mg/dL   BUN 55 (H) 6 - 20 mg/dL   Creatinine, Ser 2.13 (H) 0.61 - 1.24 mg/dL   Calcium 7.9 (L) 8.9 - 10.3 mg/dL   Total Protein 5.5 (L) 6.5 - 8.1 g/dL   Albumin 2.5 (  L) 3.5 - 5.0 g/dL   AST 16 15 - 41 U/L   ALT <5 (L) 17 - 63 U/L   Alkaline Phosphatase 80 38 - 126 U/L   Total Bilirubin 1.4 (H) 0.3 - 1.2 mg/dL   GFR calc non Af Amer 27 (L) >60 mL/min   GFR calc Af Amer 31 (L) >60 mL/min   Anion gap 10 5 - 15  CBC with Differential/Platelet     Status: Abnormal   Collection Time: 06/03/16  1:28 PM  Result Value Ref Range   WBC 19.8 (H) 3.8 - 10.6 K/uL   RBC 3.77 (L) 4.40 - 5.90 MIL/uL   Hemoglobin 11.4 (L) 13.0 - 18.0 g/dL   HCT 34.5 (L) 40.0 - 52.0 %   MCV 91.6 80.0 - 100.0 fL   MCH 30.3 26.0 - 34.0 pg   MCHC 33.1 32.0 - 36.0 g/dL   RDW 14.0 11.5 - 14.5 %   Platelets 185 150 - 440 K/uL   Neutrophils Relative % 92 %   Neutro Abs 18.2 (H) 1.4 - 6.5 K/uL   Lymphocytes Relative 2 %   Lymphs Abs 0.4 (L) 1.0 - 3.6 K/uL   Monocytes Relative 6 %   Monocytes Absolute 1.1 (H) 0.2 - 1.0 K/uL   Eosinophils Relative 0 %   Eosinophils Absolute 0.0 0 - 0.7 K/uL   Basophils Relative 0 %   Basophils Absolute 0.0 0 -  0.1 K/uL  Protime-INR     Status: Abnormal   Collection Time: 06/03/16  1:28 PM  Result Value Ref Range   Prothrombin Time 17.1 (H) 11.4 - 15.2 seconds   INR 1.38    ____________________________________________  EKG My review and personal interpretation at Time: 13:23   Indication: ams  Rate: 95  Rhythm: sinus Axis: normal Other:  Non specific st changes, no STEMI ____________________________________________  RADIOLOGY  I personally reviewed all radiographic images ordered to evaluate for the above acute complaints and reviewed radiology reports and findings.  These findings were personally discussed with the patient.  Please see medical record for radiology report.  ____________________________________________   PROCEDURES  Procedure(s) performed:  Procedures    Critical Care performed: no ____________________________________________   INITIAL IMPRESSION / ASSESSMENT AND PLAN / ED COURSE  Pertinent labs & imaging results that were available during my care of the patient were reviewed by me and considered in my medical decision making (see chart for details).  DDX: Dehydration, trauma, sepsis, pna, uti, hypoglycemia, cva, drug effect, withdrawal, encephalitis   MECCA PUFF is a 82 y.o. who presents to the ED with . Presentation initially concern for worsening hemorrhage or edema. CT head shows no evidence of interval edema. Hemorrhage does appear to be appropriately resorbing. Patient without any measured fever here but has markedly leukocytosis with acute kidney failure and metabolic acidosis with hyperglycemia. Patient given his acute respiratory failure with hypoxia requiring supplemental oxygen is concerning for aspiration pneumonia versus HCAP.  Spoke with the patient's POA who confirms the patient is a DO NOT RESUSCITATE but is agrees to IV fluids and IV antibiotics. We discussed that he does have a poor prognosis. Their goals of care are primarily comfort.  I  spoke with the hospitalist who agrees to admit patient for IVF, abx and further evaluation and management.   Clinical Course      ____________________________________________   FINAL CLINICAL IMPRESSION(S) / ED DIAGNOSES  Final diagnoses:  Acute encephalopathy  Acute respiratory failure with hypoxia [facility])  Hospital  acquired PNA  AKI (acute kidney injury) ([place])      NEW MEDICATIONS STARTED DURING THIS VISIT:  New Prescriptions   No medications on file     Note:  This document was prepared using Dragon voice recognition software and may include unintentional dictation errors.    Merlyn Lot, MD 06/03/16 [phone]

## 2016-06-03 NOTE — ED Notes (Signed)
PT. Transported to floor by Morey Hummingbird, EDT at this time in stable condition.

## 2016-06-03 NOTE — ED Notes (Signed)
Report called to 1A at this time.

## 2016-06-03 NOTE — ED Notes (Signed)
MD Robinson at bedside 

## 2016-06-03 NOTE — Progress Notes (Signed)
Pharmacy Antibiotic Note  Raymond Navarro is a 82 y.o. male admitted on 06/03/2016 with aspiration pneumonia.  Pharmacy has been consulted for Zosyn dosing. Patient received Cefepime and Vancomycin IV x 1 dose.   Plan: Will start patient on Zosyn 3.375 IV EI every 8 hours. Will continue to monitor renal function and adjust dose as needed.      Temp (24hrs), Avg:97.5 F (36.4 C), Min:97.2 F (36.2 C), Max:97.7 F (36.5 C)   Recent Labs Lab 05/27/16 2036 05/27/16 2048 05/28/16 0428 05/30/16 0412 05/31/16 0516 06/03/16 1320 06/03/16 1328  WBC 14.7*  --  14.9* 13.5* 13.1*  --  19.8*  CREATININE 1.02 0.80 1.10 0.92 0.96 2.13*  --     Estimated Creatinine Clearance: 22.5 mL/min (by C-G formula based on SCr of 2.13 mg/dL (H)).    Allergies  Allergen Reactions  . Niacin And Related Itching and Rash    Antimicrobials this admission: 12/30 Zosyn >>   Dose adjustments this admission:   Microbiology results: BCx:  UCx:  Sputum:  MRSA PCR:   Thank you for allowing pharmacy to be a part of this patient's care.  Pernell Dupre, PharmD, BCPS Clinical Pharmacist 06/03/2016 6:30 PM

## 2016-06-03 NOTE — ED Notes (Signed)
Patient is resting comfortably at this time with no signs of distress present. VS stable. Will continue to monitor.   

## 2016-06-03 NOTE — ED Notes (Signed)
Pt. More alert and interactive than at time of arrival

## 2016-06-04 ENCOUNTER — Inpatient Hospital Stay: Payer: Medicare Other

## 2016-06-04 LAB — CBC
HEMATOCRIT: 33.5 % — AB (ref 40.0–52.0)
HEMOGLOBIN: 11 g/dL — AB (ref 13.0–18.0)
MCH: 29.8 pg (ref 26.0–34.0)
MCHC: 32.8 g/dL (ref 32.0–36.0)
MCV: 90.8 fL (ref 80.0–100.0)
Platelets: 193 10*3/uL (ref 150–440)
RBC: 3.69 MIL/uL — AB (ref 4.40–5.90)
RDW: 13.6 % (ref 11.5–14.5)
WBC: 17.2 10*3/uL — ABNORMAL HIGH (ref 3.8–10.6)

## 2016-06-04 LAB — BASIC METABOLIC PANEL
ANION GAP: 7 (ref 5–15)
BUN: 62 mg/dL — ABNORMAL HIGH (ref 6–20)
CALCIUM: 7.8 mg/dL — AB (ref 8.9–10.3)
CHLORIDE: 117 mmol/L — AB (ref 101–111)
CO2: 24 mmol/L (ref 22–32)
Creatinine, Ser: 2.1 mg/dL — ABNORMAL HIGH (ref 0.61–1.24)
GFR calc non Af Amer: 27 mL/min — ABNORMAL LOW (ref >60)
GFR, EST AFRICAN AMERICAN: 31 mL/min — AB (ref >60)
GLUCOSE: 436 mg/dL — AB (ref 65–99)
Potassium: 3.6 mmol/L (ref 3.5–5.1)
Sodium: 148 mmol/L — ABNORMAL HIGH (ref 135–145)

## 2016-06-04 LAB — GLUCOSE, CAPILLARY
GLUCOSE-CAPILLARY: 336 mg/dL — AB (ref 65–99)
GLUCOSE-CAPILLARY: 204 mg/dL — AB (ref 65–99)
Glucose-Capillary: 149 mg/dL — ABNORMAL HIGH (ref 65–99)

## 2016-06-04 MED ORDER — INSULIN ASPART 100 UNIT/ML ~~LOC~~ SOLN
0.0000 [IU] | Freq: Three times a day (TID) | SUBCUTANEOUS | Status: DC
  Administered 2016-06-04: 5 [IU] via SUBCUTANEOUS
  Administered 2016-06-04: 11 [IU] via SUBCUTANEOUS
  Administered 2016-06-05: 5 [IU] via SUBCUTANEOUS
  Administered 2016-06-05 (×2): 3 [IU] via SUBCUTANEOUS
  Administered 2016-06-06: 5 [IU] via SUBCUTANEOUS
  Administered 2016-06-06: 2 [IU] via SUBCUTANEOUS
  Filled 2016-06-04: qty 2
  Filled 2016-06-04: qty 5
  Filled 2016-06-04: qty 11
  Filled 2016-06-04: qty 5
  Filled 2016-06-04: qty 3

## 2016-06-04 MED ORDER — SODIUM CHLORIDE 0.45 % IV SOLN
INTRAVENOUS | Status: DC
  Administered 2016-06-04 (×2): via INTRAVENOUS

## 2016-06-04 MED ORDER — INSULIN ASPART 100 UNIT/ML ~~LOC~~ SOLN
0.0000 [IU] | Freq: Every day | SUBCUTANEOUS | Status: DC
  Administered 2016-06-06: 2 [IU] via SUBCUTANEOUS
  Filled 2016-06-04: qty 2

## 2016-06-04 MED ORDER — INSULIN GLARGINE 100 UNIT/ML ~~LOC~~ SOLN
12.0000 [IU] | Freq: Every day | SUBCUTANEOUS | Status: DC
  Administered 2016-06-04: 12 [IU] via SUBCUTANEOUS
  Filled 2016-06-04 (×2): qty 0.12

## 2016-06-04 MED ORDER — TRAMADOL HCL 50 MG PO TABS
50.0000 mg | ORAL_TABLET | Freq: Two times a day (BID) | ORAL | Status: DC | PRN
  Administered 2016-06-04 – 2016-06-05 (×2): 50 mg via ORAL
  Filled 2016-06-04 (×2): qty 1

## 2016-06-04 MED ORDER — DEXTROSE-NACL 5-0.45 % IV SOLN: INTRAVENOUS | Status: DC

## 2016-06-04 NOTE — Progress Notes (Signed)
[place] [facility] [place] NAME: Raymond Navarro    MR#:  [id number]  DATE OF BIRTH:  1929-05-01  SUBJECTIVE:  CHIEF COMPLAINT:   Chief Complaint  Patient presents with  . Altered Mental Status   - Patient with recent fall before Christmas- sub arachnoid hemorrhage managed at [facility] presents from [facility] rehab for lethargy - remains lethargic and oriented to self now - ? Aspiration risk  REVIEW OF SYSTEMS:  Review of Systems  Unable to perform ROS: Mental status change    DRUG ALLERGIES:   Allergies  Allergen Reactions  . Niacin And Related Itching and Rash    VITALS:  Blood pressure 133/60, pulse 100, temperature 97.9 F (36.6 C), temperature source Oral, resp. rate 20, weight 64.4 kg (142 lb), SpO2 92 %.  PHYSICAL EXAMINATION:  Physical Exam  GENERAL:  82 y.o.-year-old patient lying in the bed with no acute distress. Appears confused and ill  EYES: Pupils equal, round, reactive to light and accommodation. No scleral icterus. Extraocular muscles intact.  HEENT: Head atraumatic, normocephalic. Oropharynx and nasopharynx clear.  NECK:  Supple, no jugular venous distention. No thyroid enlargement, no tenderness.  LUNGS: Normal breath sounds bilaterally, no wheezing, rales,rhonchi or crepitation. No use of accessory muscles of respiration. Decreased bibasilar breath sounds CARDIOVASCULAR: S1, S2 normal. No murmurs, rubs, or gallops.  ABDOMEN: Soft, nontender, nondistended. Bowel sounds present. No organomegaly or mass.  EXTREMITIES: No pedal edema, cyanosis, or clubbing.  NEUROLOGIC: following some simple commands. Oriented to self. PSYCHIATRIC: The patient is lethargic, arousable.  SKIN: No obvious rash, lesion, or ulcer.    LABORATORY PANEL:   CBC  Recent Labs Lab 06/04/16 0451  WBC 17.2*  HGB 11.0*  HCT 33.5*  PLT 193    ------------------------------------------------------------------------------------------------------------------  Chemistries   Recent Labs Lab 06/03/16 1320 06/04/16 0451  NA 142 148*  K 4.1 3.6  CL 109 117*  CO2 23 24  GLUCOSE 508* 436*  BUN 55* 62*  CREATININE 2.13* 2.10*  CALCIUM 7.9* 7.8*  AST 16  --   ALT <5*  --   ALKPHOS 80  --   BILITOT 1.4*  --    ------------------------------------------------------------------------------------------------------------------  Cardiac Enzymes No results for input(s): TROPONINI in the last 168 hours. ------------------------------------------------------------------------------------------------------------------  RADIOLOGY:  Dg Chest 2 View  Result Date: 06/03/2016 CLINICAL DATA:  Unresponsive patient. Recently discharged after having a traumatic subarachnoid bleed. EXAM: CHEST  2 VIEW COMPARISON:  05/28/2016 FINDINGS: Cardiac silhouette is normal in size. No mediastinal or hilar masses. Stable elevation the right hemidiaphragm. There are prominent bronchovascular markings and mild apical lung scarring, stable from the prior study. No evidence of pneumonia or pulmonary edema. No convincing pleural effusion or pneumothorax. Left anterior chest wall single lead pacemaker is stable. Skeletal structures are demineralized but grossly intact. IMPRESSION: No acute cardiopulmonary disease. Electronically Signed   By: Lajean Manes M.D.   On: 06/03/2016 13:48   Ct Head Wo Contrast  Result Date: 06/03/2016 CLINICAL DATA:  Unresponsive this am, s/p fall last week with bleed EXAM: CT HEAD WITHOUT CONTRAST TECHNIQUE: Contiguous axial images were obtained from the base of the skull through the vertex without intravenous contrast. COMPARISON:  05/28/2016 FINDINGS: Brain: There is some residual hyperattenuation along the anterolateral left parietal lobe and right frontal lobe consistent with the expected evolution of the previously seen  parenchymal hemorrhages. There is no new intracranial hemorrhage. The ventricles are normal in size, for the patient's age, and normal  in configuration. There are no parenchymal masses or mass effect. There is no evidence of a recent cortical infarct. Patchy white matter hypoattenuation is noted consistent with moderate chronic microvascular ischemic change. There are no extra-axial masses or abnormal fluid collections. Vascular: No hyperdense vessel or unexpected calcification. Skull: Normal. Negative for fracture or focal lesion. Sinuses/Orbits: Globes and orbits are unremarkable. Visualized sinuses and mastoid air cells are clear. Other: None. IMPRESSION: 1. Small areas of parenchymal hemorrhage seen on the prior exam have evolved, being somewhat less focal dense. 2. There is no new intracranial hemorrhage. 3. There is no evidence of an infarct or hydrocephalus. No other change from the prior study. Electronically Signed   By: Lajean Manes M.D.   On: 06/03/2016 15:24    EKG:   Orders placed or performed during the hospital encounter of 06/03/16  . EKG 12-Lead  . EKG 12-Lead  . EKG 12-Lead  . EKG 12-Lead    ASSESSMENT AND PLAN:   82 year old male with past medical history of Parkinson's disease, hypertension, hyperlipidemia, diabetes, chronic atrial fibrillation, status post recent mechanical fall with a subarachnoid hemorrhage who presents to the hospital due to altered mental status and noted to be hypoxic.  1. Acute respiratory failure with hypoxia-suspected to be secondary to aspiration pneumonia/pneumonitis. -Chest x-ray is negative for any acute pathology. -Continue O2 supplementation, - speech therapy consult pending - continue zosyn IV.  2. DM with hyperglycemia- oral meds on hold - started lantus as sugars in 400 range while NPO - check a1c and also added SSI  3. Acute kidney injury- from ATN - continue IV fluids, avoid nephrotoxins - If no improvement- check renal  US  4. Altered mental status-metabolic encephalopathy secondary to pneumonia/acute kidney injury. -Patient did have a recent subarachnoid hemorrhage with CT scan admitted in the ER shows improvement in the evolving hemorrhage. -Continue antibiotics, treat underlying acute kidney injury  5. History of Parkinson's disease-continue Sinemet.  6. Hypernatremia- from poor PO intake. Change fluids to 1/2 NS, will need to get sugars better controlled prior to changing fluids to D5  7. DVT Prophylaxis- with recent SAH, will just do TEDs and SCDs   Updated wife at bedside.  All the records are reviewed and case discussed with Care Management/Social Workerr. Management plans discussed with the patient, family and they are in agreement.  CODE STATUS: DNR  TOTAL TIME TAKING CARE OF THIS PATIENT: 38 minutes.   POSSIBLE D/C IN 1-2 DAYS, DEPENDING ON CLINICAL CONDITION.   Gladstone Lighter M.D on 06/04/2016 at 12:01 PM  Between 7am to 6pm - Pager - [phone]  After 6pm go to [email or web address] - password [email or web address] [facility]  Office  [phone]  CC: Primary care physician; Rusty Aus, MD

## 2016-06-04 NOTE — Plan of Care (Signed)
Problem: Education: Goal: Knowledge of [place] [facility] Education information/materials will improve Outcome: Not Progressing Pt is alert to self only, pt is confused to place, situation, and time. Will continue to monitor pt.

## 2016-06-05 ENCOUNTER — Inpatient Hospital Stay: Payer: Medicare Other

## 2016-06-05 LAB — BASIC METABOLIC PANEL
Anion gap: 7 (ref 5–15)
BUN: 63 mg/dL — AB (ref 6–20)
CALCIUM: 7.7 mg/dL — AB (ref 8.9–10.3)
CO2: 23 mmol/L (ref 22–32)
CREATININE: 2.39 mg/dL — AB (ref 0.61–1.24)
Chloride: 123 mmol/L — ABNORMAL HIGH (ref 101–111)
GFR calc Af Amer: 27 mL/min — ABNORMAL LOW (ref >60)
GFR, EST NON AFRICAN AMERICAN: 23 mL/min — AB (ref >60)
Glucose, Bld: 226 mg/dL — ABNORMAL HIGH (ref 65–99)
Potassium: 3.5 mmol/L (ref 3.5–5.1)
SODIUM: 153 mmol/L — AB (ref 135–145)

## 2016-06-05 LAB — CBC
HCT: 32.5 % — ABNORMAL LOW (ref 40.0–52.0)
Hemoglobin: 10.9 g/dL — ABNORMAL LOW (ref 13.0–18.0)
MCH: 30.2 pg (ref 26.0–34.0)
MCHC: 33.5 g/dL (ref 32.0–36.0)
MCV: 90.4 fL (ref 80.0–100.0)
PLATELETS: 202 10*3/uL (ref 150–440)
RBC: 3.6 MIL/uL — ABNORMAL LOW (ref 4.40–5.90)
RDW: 13.7 % (ref 11.5–14.5)
WBC: 16.3 10*3/uL — AB (ref 3.8–10.6)

## 2016-06-05 LAB — GLUCOSE, CAPILLARY
GLUCOSE-CAPILLARY: 165 mg/dL — AB (ref 65–99)
Glucose-Capillary: 239 mg/dL — ABNORMAL HIGH (ref 65–99)
Glucose-Capillary: 193 mg/dL — ABNORMAL HIGH (ref 65–99)
Glucose-Capillary: 178 mg/dL — ABNORMAL HIGH (ref 65–99)

## 2016-06-05 LAB — MAGNESIUM: MAGNESIUM: 2.5 mg/dL — AB (ref 1.7–2.4)

## 2016-06-05 MED ORDER — DEXTROSE 5 % IV SOLN
INTRAVENOUS | Status: AC
  Administered 2016-06-05: 10:00:00 via INTRAVENOUS

## 2016-06-05 MED ORDER — SODIUM CHLORIDE 0.9 % IV SOLN
30.0000 meq | Freq: Once | INTRAVENOUS | Status: AC
  Administered 2016-06-05: 30 meq via INTRAVENOUS
  Filled 2016-06-05: qty 15

## 2016-06-05 MED ORDER — METOPROLOL TARTRATE 25 MG PO TABS
25.0000 mg | ORAL_TABLET | ORAL | Status: AC
  Administered 2016-06-05: 25 mg via ORAL
  Filled 2016-06-05: qty 1

## 2016-06-05 MED ORDER — INSULIN GLARGINE 100 UNIT/ML ~~LOC~~ SOLN
20.0000 [IU] | Freq: Every day | SUBCUTANEOUS | Status: DC
  Administered 2016-06-05 – 2016-06-07 (×3): 20 [IU] via SUBCUTANEOUS
  Filled 2016-06-05 (×3): qty 0.2

## 2016-06-05 MED ORDER — IPRATROPIUM-ALBUTEROL 0.5-2.5 (3) MG/3ML IN SOLN: 3.0000 mL | Freq: Four times a day (QID) | RESPIRATORY_TRACT | Status: DC | PRN

## 2016-06-05 NOTE — Progress Notes (Signed)
Pt spo2 readings 82-88% on 4LNC. MD kalisetti paged. She stated to put pt on NRB mask at 6L oxygen and that she will order a STAT CXR. Family at the bedside and updated. No new orders received at this time.

## 2016-06-05 NOTE — NC FL2 (Signed)
[place] LEVEL OF CARE SCREENING TOOL     IDENTIFICATION  Patient Name: Raymond Navarro Birthdate: 18-Jun-1928 Sex: male Admission Date (Current Location): 06/03/2016  [place] and [place] Number:  Engineering geologist and Address:  [facility], [address], [place], [place] [address]      Provider Number: [id number]  Attending Physician Name and Address:  Gladstone Lighter, MD  Relative Name and Phone Number:       Current Level of Care: Hospital Recommended Level of Care: [place] Prior Approval Number:    Date Approved/Denied:   PASRR Number:  ([id number] )  Discharge Plan: SNF    Current Diagnoses: Patient Active Problem List   Diagnosis Date Noted  . Altered mental status 06/03/2016  . Traumatic subarachnoid bleed with LOC of 30 minutes or less, initial encounter ([place]) 05/28/2016  . Facial laceration 05/28/2016  . Fracture of left orbital floor ([place]) 05/28/2016  . Diabetes mellitus type 2 in nonobese ([place]) 05/28/2016  . Parkinson's disease ([place]) 05/28/2016  . Hyperlipidemia 05/28/2016  . Hypokalemia 05/28/2016  . Preop cardiovascular exam 01/09/2013  . Atrial fibrillation ([place]) 04/10/2012  . Pacemaker-Medtronic 01/12/2012    Orientation RESPIRATION BLADDER Height & Weight     Self  O2 (3 Liters Oxygen ) Incontinent Weight: 142 lb (64.4 kg) Height:     BEHAVIORAL SYMPTOMS/MOOD NEUROLOGICAL BOWEL NUTRITION STATUS   (none)  (none) Incontinent Diet (NPO to be advanced. )  AMBULATORY STATUS COMMUNICATION OF NEEDS Skin   Extensive Assist Verbally Other (Comment) (Laceration to Left face, eyes and arm. )                       Personal Care Assistance Level of Assistance  Bathing, Feeding, Dressing Bathing Assistance: Limited assistance Feeding assistance: Independent Dressing Assistance: Limited assistance     Functional Limitations Info  Sight, Hearing, Speech Sight Info: Adequate Hearing  Info: Impaired Speech Info: Adequate    SPECIAL CARE FACTORS FREQUENCY  PT (By licensed PT), OT (By licensed OT)     PT Frequency:  (5) OT Frequency:  (5)            Contractures      Additional Factors Info  Code Status, Allergies, Insulin Sliding Scale Code Status Info:  (DNR ) Allergies Info:  (Niacin And Related)   Insulin Sliding Scale Info:  (NovoLog Insulin Injections )       Current Medications (06/05/2016):  This is the current hospital active medication list Current Facility-Administered Medications  Medication Dose Route Frequency Provider Last Rate Last Dose  . acetaminophen (TYLENOL) tablet 650 mg  650 mg Oral Q6H PRN Henreitta Leber, MD       Or  . acetaminophen (TYLENOL) suppository 650 mg  650 mg Rectal Q6H PRN Henreitta Leber, MD      . alfuzosin (UROXATRAL) 24 hr tablet 10 mg  10 mg Oral Daily Henreitta Leber, MD   10 mg at 06/05/16 0951  . B-complex with vitamin C tablet 1 tablet  1 tablet Oral Daily Henreitta Leber, MD   1 tablet at 06/05/16 0951  . brimonidine (ALPHAGAN) 0.2 % ophthalmic solution 2 drop  2 drop Both Eyes BID Henreitta Leber, MD   2 drop at 06/05/16 0951  . carbidopa-levodopa (SINEMET IR) 25-100 MG per tablet immediate release 1 tablet  1 tablet Oral TID Henreitta Leber, MD   1 tablet at 06/05/16 0951  .  cholecalciferol (VITAMIN D) tablet 400 Units  400 Units Oral Daily Henreitta Leber, MD   400 Units at 06/05/16 [phone]  . dextrose 5 % solution   Intravenous Continuous Gladstone Lighter, MD 60 mL/hr at 06/05/16 0951    . diltiazem (CARDIZEM CD) 24 hr capsule 180 mg  180 mg Oral Daily Henreitta Leber, MD   Stopped at 06/05/16 1000  . hydrALAZINE (APRESOLINE) tablet 10 mg  10 mg Oral TID Henreitta Leber, MD   10 mg at 06/05/16 0951  . insulin aspart (novoLOG) injection 0-15 Units  0-15 Units Subcutaneous TID WC Gladstone Lighter, MD   5 Units at 06/05/16 0810  . insulin aspart (novoLOG) injection 0-5 Units  0-5 Units Subcutaneous QHS Gladstone Lighter, MD      . insulin glargine (LANTUS) injection 20 Units  20 Units Subcutaneous Daily Gladstone Lighter, MD      . ondansetron (ZOFRAN) tablet 4 mg  4 mg Oral Q6H PRN Henreitta Leber, MD       Or  . ondansetron (ZOFRAN) injection 4 mg  4 mg Intravenous Q6H PRN Henreitta Leber, MD      . piperacillin-tazobactam (ZOSYN) IVPB 3.375 g  3.375 g Intravenous Q8H Sheema M Hallaji, RPH   3.375 g at 06/05/16 0547  . potassium chloride 30 mEq in sodium chloride 0.9 % 265 mL (KCL MULTIRUN) IVPB  30 mEq Intravenous Once Gladstone Lighter, MD   30 mEq at 06/05/16 1036  . pravastatin (PRAVACHOL) tablet 20 mg  20 mg Oral QHS Henreitta Leber, MD   20 mg at 06/04/16 2130  . sertraline (ZOLOFT) tablet 50 mg  50 mg Oral Daily Henreitta Leber, MD   50 mg at 06/05/16 0951  . traMADol (ULTRAM) tablet 50 mg  50 mg Oral Q12H PRN Gladstone Lighter, MD   50 mg at 06/04/16 1028     Discharge Medications: Please see discharge summary for a list of discharge medications.  Relevant Imaging Results:  Relevant Lab Results:   Additional Information  (SSN: [id number])  Trevaris Pennella, Veronia Beets, LCSW

## 2016-06-05 NOTE — Progress Notes (Addendum)
[place] [facility] [place] NAME: Raymond Navarro    MR#:  [id number]  DATE OF BIRTH:  [birth year]  SUBJECTIVE:  CHIEF COMPLAINT:   Chief Complaint  Patient presents with  . Altered Mental Status   - Patient remains lethargic and oriented to self -Denies any complaints. Retaining urine, so Foley catheter placed. Draining dark urine. -Sodium is still elevated.  REVIEW OF SYSTEMS:  Review of Systems  Unable to perform ROS: Mental status change    DRUG ALLERGIES:   Allergies  Allergen Reactions  . Niacin And Related Itching and Rash    VITALS:  Blood pressure 105/61, pulse 83, temperature 98.4 F (36.9 C), temperature source Axillary, resp. rate 20, weight 64.4 kg (142 lb), SpO2 91 %.  PHYSICAL EXAMINATION:  Physical Exam  GENERAL:  83 y.o.-year-old patient lying in the bed with no acute distress. Appears confused and ill  EYES: Pupils equal, round, reactive to light and accommodation. No scleral icterus. Extraocular muscles intact.  HEENT: Head atraumatic, normocephalic. Oropharynx and nasopharynx clear.  NECK:  Supple, no jugular venous distention. No thyroid enlargement, no tenderness.  LUNGS: Normal breath sounds bilaterally, Scattered wheezing, no rales,rhonchi or crepitation. No use of accessory muscles of respiration. Decreased bibasilar breath sounds CARDIOVASCULAR: S1, S2 normal. No rubs, or gallops. 2/6 systolic murmur present. ABDOMEN: Soft, nontender, nondistended. Bowel sounds present. No organomegaly or mass.  EXTREMITIES: No pedal edema, cyanosis, or clubbing.  NEUROLOGIC: following some simple commands. Weak in lower extremities, tremors of both arms noted. Oriented to self. PSYCHIATRIC: The patient is lethargic, arousable.  SKIN: No obvious rash, lesion, or ulcer.    LABORATORY PANEL:   CBC  Recent Labs Lab 06/05/16 0427  WBC 16.3*  HGB 10.9*  HCT 32.5*  PLT 202    ------------------------------------------------------------------------------------------------------------------  Chemistries   Recent Labs Lab 06/03/16 1320  06/05/16 0427  NA 142  < > 153*  K 4.1  < > 3.5  CL 109  < > 123*  CO2 23  < > 23  GLUCOSE 508*  < > 226*  BUN 55*  < > 63*  CREATININE 2.13*  < > 2.39*  CALCIUM 7.9*  < > 7.7*  MG  --   --  2.5*  AST 16  --   --   ALT <5*  --   --   ALKPHOS 80  --   --   BILITOT 1.4*  --   --   < > = values in this interval not displayed. ------------------------------------------------------------------------------------------------------------------  Cardiac Enzymes No results for input(s): TROPONINI in the last 168 hours. ------------------------------------------------------------------------------------------------------------------  RADIOLOGY:  Dg Chest 2 View  Result Date: 06/03/2016 CLINICAL DATA:  Unresponsive patient. Recently discharged after having a traumatic subarachnoid bleed. EXAM: CHEST  2 VIEW COMPARISON:  05/28/2016 FINDINGS: Cardiac silhouette is normal in size. No mediastinal or hilar masses. Stable elevation the right hemidiaphragm. There are prominent bronchovascular markings and mild apical lung scarring, stable from the prior study. No evidence of pneumonia or pulmonary edema. No convincing pleural effusion or pneumothorax. Left anterior chest wall single lead pacemaker is stable. Skeletal structures are demineralized but grossly intact. IMPRESSION: No acute cardiopulmonary disease. Electronically Signed   By: Lajean Manes M.D.   On: 06/03/2016 13:48   Ct Head Wo Contrast  Result Date: 06/03/2016 CLINICAL DATA:  Unresponsive this am, s/p fall last week with bleed EXAM: CT HEAD WITHOUT CONTRAST TECHNIQUE: Contiguous axial images were obtained from the base of  the skull through the vertex without intravenous contrast. COMPARISON:  05/28/2016 FINDINGS: Brain: There is some residual hyperattenuation along the  anterolateral left parietal lobe and right frontal lobe consistent with the expected evolution of the previously seen parenchymal hemorrhages. There is no new intracranial hemorrhage. The ventricles are normal in size, for the patient's age, and normal in configuration. There are no parenchymal masses or mass effect. There is no evidence of a recent cortical infarct. Patchy white matter hypoattenuation is noted consistent with moderate chronic microvascular ischemic change. There are no extra-axial masses or abnormal fluid collections. Vascular: No hyperdense vessel or unexpected calcification. Skull: Normal. Negative for fracture or focal lesion. Sinuses/Orbits: Globes and orbits are unremarkable. Visualized sinuses and mastoid air cells are clear. Other: None. IMPRESSION: 1. Small areas of parenchymal hemorrhage seen on the prior exam have evolved, being somewhat less focal dense. 2. There is no new intracranial hemorrhage. 3. There is no evidence of an infarct or hydrocephalus. No other change from the prior study. Electronically Signed   By: Lajean Manes M.D.   On: 06/03/2016 15:24   US Renal  Result Date: 06/04/2016 CLINICAL DATA:  Acute renal failure EXAM: RENAL / URINARY TRACT ULTRASOUND COMPLETE COMPARISON:  CT 06/15/2010 FINDINGS: Right Kidney: Length: 11.6 cm. Mild to moderate hydronephrosis. Increased echotexture throughout the right kidney. Small cyst in the upper pole measures 2 cm. Left Kidney: Length: 12.4 cm. Mild left hydronephrosis. Multiple left renal cysts, the largest 3.7 cm in the lower pole. Mildly increased echotexture. Bladder: Distended.  No bladder wall abnormality. IMPRESSION: Moderate bladder distention. There is mild bilateral hydronephrosis. Mildly increased echotexture bilaterally suggesting chronic medical renal disease. Bilateral renal cysts which appear benign. Electronically Signed   By: Rolm Baptise M.D.   On: 06/04/2016 14:43   Dg Chest Port 1 View  Result Date:  06/05/2016 CLINICAL DATA:  Patient with decreased oxygen saturation. EXAM: PORTABLE CHEST 1 VIEW COMPARISON:  Chest radiograph 06/03/2016. FINDINGS: Patient is rotated. Single lead pacer apparatus overlies the left hemithorax, lead is stable in position. Stable cardiac and mediastinal contours with aortic atherosclerosis. Elevation right hemidiaphragm. Interval development of consolidation within the right lower hemithorax. No pleural effusion or pneumothorax. IMPRESSION: New focal consolidation right lower hemithorax may represent pneumonia in the appropriate clinical setting. Followup PA and lateral chest X-ray is recommended in 3-4 weeks following trial of antibiotic therapy to ensure resolution and exclude underlying malignancy. Electronically Signed   By: Lovey Newcomer M.D.   On: 06/05/2016 11:52    EKG:   Orders placed or performed during the hospital encounter of 06/03/16  . EKG 12-Lead  . EKG 12-Lead  . EKG 12-Lead  . EKG 12-Lead    ASSESSMENT AND PLAN:   83 year old male with past medical history of Parkinson's disease, hypertension, hyperlipidemia, diabetes, chronic atrial fibrillation, status post recent mechanical fall with a subarachnoid hemorrhage who presents to the hospital due to altered mental status and noted to be hypoxic.  1. Acute respiratory failure with hypoxia-suspected to be secondary to aspiration pneumonia/pneumonitis. -Chest x-ray is negative for any acute pathology. -Continue O2 supplementation, prn nebs added. Patient is a mouth breather - speech therapy consult pending - continue zosyn IV.  2. DM with hyperglycemia- oral meds on hold - started lantus as sugars in 400 range on admission while NPO but now started on D5 - a1c is 7.2 and also added SSI  3. Acute kidney injury- from ATN and urinary retention - on IV fluids, avoid nephrotoxins -  foley placed today - renal US with distended bladder (prior to foley), cortico renal disease and mild  hydronephrosis  4. Altered mental status-metabolic encephalopathy secondary to pneumonia/acute kidney injury. -Patient did have a recent subarachnoid hemorrhage with CT scan admitted in the ER shows improvement in the evolving hemorrhage. -Continue antibiotics, treat underlying acute kidney injury  5. History of Parkinson's disease-continue Sinemet.  6. Hypernatremia- from poor PO intake. Did not improve with 1/2NS- start D5W today  7. DVT Prophylaxis- with recent SAH, will just do TEDs and SCDs  Clinically worsening. Discussed with family in consultative palliative care. Updated wife at bedside.  All the records are reviewed and case discussed with Care Management/Social Workerr. Management plans discussed with the patient, family and they are in agreement.  CODE STATUS: DNR  TOTAL TIME TAKING CARE OF THIS PATIENT: 38 minutes.   POSSIBLE D/C IN 1-2 DAYS, DEPENDING ON CLINICAL CONDITION.   Gladstone Lighter M.D on 06/05/2016 at 1:02 PM  Between 7am to 6pm - Pager - [phone]  After 6pm go to [email or web address] - password [email or web address] [place] [facility]  Office  [phone]  CC: Primary care physician; Rusty Aus, MD

## 2016-06-05 NOTE — Clinical Social Work Note (Signed)
Clinical Social Work Assessment  Patient Details  Name: Raymond Navarro MRN: [id number] Date of Birth: 06/03/1929  Date of referral:  06/05/16               Reason for consult:  Other (Comment Required) (From [facility] )                Permission sought to share information with:  Chartered certified accountant granted to share information::  Yes, Verbal Permission Granted  Name::      Retail buyer::   Raymond Navarro   Relationship::     Contact Information:     Housing/Transportation Living arrangements for the past 2 months:  [place], Charity fundraiser of Information:  Adult Children, Spouse Patient Interpreter Needed:  None Criminal Activity/Legal Involvement Pertinent to Current Situation/Hospitalization:  No - Comment as needed Significant Relationships:  Adult Children, Spouse Lives with:  Spouse, Facility Resident Do you feel safe going back to the place where you live?    Need for family participation in patient care:  Yes (Comment)  Care giving concerns:  Patient came to [facility] from [facility].   Social Worker assessment / plan:  Holiday representative ([place]) received verbal consult from RN case Freight forwarder that patient is from [facility]. Per Crystal on call admissions coordinator at [facility] patient is from SNF and him and his wife live at [facility] independent living. Per Crystal patient can return to [facility] when stable for D/C. CSW met with patient and his wife Raymond Navarro and daughter Raymond Navarro were at bedside. CSW introduced self and explained role of CSW department. Patient did not participate in assessment. Per daughter patient was recently at [facility] and went to [facility] from there. Daughter stated that their family has been through a lot this past week being in and out of hospitals and rehab. CSW provided emotional support. Wife and daughter are agreeable for patient to return to [facility] when  stable for D/C. FL2 complete and sent to [facility]. CSW will continue to follow and assist as needed.   Employment status:  Disabled (Comment on whether or not currently receiving Disability) Insurance information:  Medicare PT Recommendations:  Not assessed at this time Information / Referral to community resources:  [place]  Patient/Family's Response to care:  Patient's wife and daughter are agreeable for patient to return to [facility] when stable.   Patient/Family's Understanding of and Emotional Response to Diagnosis, Current Treatment, and Prognosis:  Patient's wife and daughter were pleasant and thanked CSW for visit.   Emotional Assessment Appearance:  Appears stated age Attitude/Demeanor/Rapport:  Unable to Assess Affect (typically observed):  Unable to Assess Orientation:  Oriented to Self, Oriented to Place, Oriented to  Time, Fluctuating Orientation (Suspected and/or reported Sundowners) Alcohol / Substance use:  Not Applicable Psych involvement (Current and /or in the community):  No (Comment)  Discharge Needs  Concerns to be addressed:  Discharge Planning Concerns Readmission within the last 30 days:  Yes Current discharge risk:  Dependent with Mobility, Chronically ill Barriers to Discharge:  Continued Medical Work up   [employer], Veronia Beets, LCSW 06/05/2016, 12:24 PM

## 2016-06-05 NOTE — Progress Notes (Signed)
Patients vitals 174/74 HR 140's and appears to be at rest. Paged prime doc [phone] waiting for response

## 2016-06-05 NOTE — Care Management (Signed)
Patient presents from [facility] health care facility.  Per patient's wife- until 12/23 everything was fine.  Patient indepedent.  Had unwitnessed fall 12/23 and went to [facility] then discharged to skilled nursing.  Wife says that patient was not able to communicate circumstances surrounding the fall.  Patient has a history of parkinsons. He was found to have a small subarachnoid  when admitted to [facility]. Patient's wife  verbalizes concerns that the nurses at the skilled nursing are not being attentive enough-  that patient needs ore nursing care than he is currently receiving.  She verbalizes that she does not think patient will survive this event and she does not know how to navigate care planning system, who or what to discuss. Encourage her to speak with administration at the facility about her concerns.  Updated CSW.  It is anticipated that patient will return to the skilled nursing level of care at [facility] at discharge.

## 2016-06-05 NOTE — Progress Notes (Signed)
Pt's HR in 140's, per MD Rosilyn Mings give 1000 dose of diltiazem 180 mg now.

## 2016-06-05 NOTE — Progress Notes (Signed)
   [facility] at [facility] Day: 2 days Raymond Navarro is a 83 y.o. male presenting with Altered Mental Status He had a recent admission for subarachnoid hemorrhage at [facility]. Now with altered mental status, worsening Parkinson's disease and possible aspiration pneumonia causing hypoxic respiratory failure. Acute renal failure without improvement. Likely has poor prognosis. Advance care planning discussed with patient's wife and daughter and son-in-law at bedside.. All questions in regards to overall condition and expected prognosis answered. The decision was made to continue his DO NOT RESUSCITATE status.  Palliative care has been consulted  CODE STATUS: DO NOT RESUSCITATE Time spent: 18 minutes

## 2016-06-06 LAB — CBC WITH DIFFERENTIAL/PLATELET
BASOS ABS: 0.1 10*3/uL (ref 0–0.1)
BASOS PCT: 0 %
EOS ABS: 0.1 10*3/uL (ref 0–0.7)
Eosinophils Relative: 0 %
HEMATOCRIT: 35.7 % — AB (ref 40.0–52.0)
HEMOGLOBIN: 12 g/dL — AB (ref 13.0–18.0)
Lymphocytes Relative: 3 %
Lymphs Abs: 0.8 10*3/uL — ABNORMAL LOW (ref 1.0–3.6)
MCH: 30.6 pg (ref 26.0–34.0)
MCHC: 33.6 g/dL (ref 32.0–36.0)
MCV: 90.9 fL (ref 80.0–100.0)
Monocytes Absolute: 0.9 10*3/uL (ref 0.2–1.0)
Monocytes Relative: 4 %
NEUTROS ABS: 21.8 10*3/uL — AB (ref 1.4–6.5)
Neutrophils Relative %: 93 %
Platelets: 217 10*3/uL (ref 150–440)
RBC: 3.92 MIL/uL — AB (ref 4.40–5.90)
RDW: 13.6 % (ref 11.5–14.5)
WBC: 23.7 10*3/uL — AB (ref 3.8–10.6)

## 2016-06-06 LAB — HEMOGLOBIN A1C
HEMOGLOBIN A1C: 7.6 % — AB (ref 4.8–5.6)
MEAN PLASMA GLUCOSE: 171 mg/dL

## 2016-06-06 LAB — BASIC METABOLIC PANEL
ANION GAP: 4 — AB (ref 5–15)
BUN: 56 mg/dL — ABNORMAL HIGH (ref 6–20)
CALCIUM: 8.1 mg/dL — AB (ref 8.9–10.3)
CO2: 25 mmol/L (ref 22–32)
CREATININE: 2.15 mg/dL — AB (ref 0.61–1.24)
Chloride: 129 mmol/L — ABNORMAL HIGH (ref 101–111)
GFR, EST AFRICAN AMERICAN: 31 mL/min — AB (ref >60)
GFR, EST NON AFRICAN AMERICAN: 26 mL/min — AB (ref >60)
Glucose, Bld: 121 mg/dL — ABNORMAL HIGH (ref 65–99)
Potassium: 3.4 mmol/L — ABNORMAL LOW (ref 3.5–5.1)
SODIUM: 158 mmol/L — AB (ref 135–145)

## 2016-06-06 LAB — GLUCOSE, CAPILLARY
GLUCOSE-CAPILLARY: 113 mg/dL — AB (ref 65–99)
GLUCOSE-CAPILLARY: 148 mg/dL — AB (ref 65–99)
GLUCOSE-CAPILLARY: 203 mg/dL — AB (ref 65–99)
GLUCOSE-CAPILLARY: 207 mg/dL — AB (ref 65–99)

## 2016-06-06 MED ORDER — HYDRALAZINE HCL 20 MG/ML IJ SOLN
5.0000 mg | Freq: Three times a day (TID) | INTRAMUSCULAR | Status: DC
  Administered 2016-06-06 – 2016-06-07 (×3): 5 mg via INTRAVENOUS
  Filled 2016-06-06 (×3): qty 1

## 2016-06-06 MED ORDER — METOPROLOL TARTRATE 5 MG/5ML IV SOLN
2.5000 mg | Freq: Two times a day (BID) | INTRAVENOUS | Status: DC
  Administered 2016-06-06 – 2016-06-07 (×3): 2.5 mg via INTRAVENOUS
  Filled 2016-06-06 (×3): qty 5

## 2016-06-06 MED ORDER — LORAZEPAM 2 MG/ML IJ SOLN
0.5000 mg | Freq: Four times a day (QID) | INTRAMUSCULAR | Status: DC | PRN
  Administered 2016-06-07: 0.5 mg via INTRAVENOUS
  Filled 2016-06-06: qty 1

## 2016-06-06 MED ORDER — MORPHINE SULFATE (PF) 2 MG/ML IV SOLN
1.0000 mg | INTRAVENOUS | Status: DC | PRN
  Administered 2016-06-06 – 2016-06-07 (×3): 1 mg via INTRAVENOUS
  Filled 2016-06-06 (×3): qty 1

## 2016-06-06 MED ORDER — LORAZEPAM 2 MG/ML IJ SOLN
1.0000 mg | Freq: Once | INTRAMUSCULAR | Status: AC
  Administered 2016-06-06: 1 mg via INTRAVENOUS
  Filled 2016-06-06: qty 1

## 2016-06-06 MED ORDER — DEXTROSE 5 % IV SOLN
INTRAVENOUS | Status: DC
  Administered 2016-06-06 – 2016-06-07 (×2): via INTRAVENOUS

## 2016-06-06 NOTE — Progress Notes (Signed)
Advance care planning  Patient is drowsy and confused. Unable to participate in decision-making. But with family including his wife and daughter. Discussed patient's acute illness, treatment plan and poor prognosis. Advised he still has hypernatremia and acute renal failure. Worsening pneumonia.  Confirmed CODE STATUS is DO NOT RESUSCITATE.  Patient has been declining since discharge from: [facility] with poor oral intake.  We will continue D5 for 1 more day and if there is no improvement patient will be transitioned to comfort measures and transferred to hospice home.  Time spent 20 minutes.

## 2016-06-06 NOTE — Progress Notes (Signed)
[place] at [place] NAME: Raymond Navarro    MR#:  [id number]  DATE OF BIRTH:  1928/11/04  SUBJECTIVE:  CHIEF COMPLAINT:   Chief Complaint  Patient presents with  . Altered Mental Status   Continues to be lethargic and unresponsive.  REVIEW OF SYSTEMS:  Review of Systems  Unable to perform ROS: Mental status change    DRUG ALLERGIES:   Allergies  Allergen Reactions  . Niacin And Related Itching and Rash    VITALS:  Blood pressure (!) 143/57, pulse 84, temperature 98.2 F (36.8 C), temperature source Oral, resp. rate 18, weight 64.4 kg (142 lb), SpO2 97 %.  PHYSICAL EXAMINATION:  Physical Exam  GENERAL:  83 y.o.-year-old patient lying in the bed with no acute distress. Looks critically ill  EYES: Pupils equal, round, reactive to light and accommodation. No scleral icterus. Extraocular muscles intact.  HEENT: bruising around his left eye. NECK:  Supple, no jugular venous distention. No thyroid enlargement, no tenderness.  LUNGS: Normal breath sounds bilaterally, Scattered wheezing, no rales,rhonchi or crepitation. No use of accessory muscles of respiration. Decreased bibasilar breath sounds CARDIOVASCULAR: S1, S2 normal. No rubs, or gallops. 2/6 systolic murmur present. ABDOMEN: Soft, nontender, nondistended. Bowel sounds present. No organomegaly or mass.  EXTREMITIES: No pedal edema, cyanosis, or clubbing.  NEUROLOGIC: Drowzy PSYCHIATRIC: The patient is lethargic, arousable.   LABORATORY PANEL:   CBC  Recent Labs Lab 06/06/16 0907  WBC 23.7*  HGB 12.0*  HCT 35.7*  PLT 217   ------------------------------------------------------------------------------------------------------------------  Chemistries   Recent Labs Lab 06/03/16 1320  06/05/16 0427 06/06/16 0907  NA 142  < > 153* 158*  K 4.1  < > 3.5 3.4*  CL 109  < > 123* 129*  CO2 23  < > 23 25  GLUCOSE 508*  < > 226* 121*  BUN 55*  < > 63* 56*    CREATININE 2.13*  < > 2.39* 2.15*  CALCIUM 7.9*  < > 7.7* 8.1*  MG  --   --  2.5*  --   AST 16  --   --   --   ALT <5*  --   --   --   ALKPHOS 80  --   --   --   BILITOT 1.4*  --   --   --   < > = values in this interval not displayed. ------------------------------------------------------------------------------------------------------------------  Cardiac Enzymes No results for input(s): TROPONINI in the last 168 hours. ------------------------------------------------------------------------------------------------------------------  RADIOLOGY:  Dg Chest Port 1 View  Result Date: 06/05/2016 CLINICAL DATA:  Patient with decreased oxygen saturation. EXAM: PORTABLE CHEST 1 VIEW COMPARISON:  Chest radiograph 06/03/2016. FINDINGS: Patient is rotated. Single lead pacer apparatus overlies the left hemithorax, lead is stable in position. Stable cardiac and mediastinal contours with aortic atherosclerosis. Elevation right hemidiaphragm. Interval development of consolidation within the right lower hemithorax. No pleural effusion or pneumothorax. IMPRESSION: New focal consolidation right lower hemithorax may represent pneumonia in the appropriate clinical setting. Followup PA and lateral chest X-ray is recommended in 3-4 weeks following trial of antibiotic therapy to ensure resolution and exclude underlying malignancy. Electronically Signed   By: Lovey Newcomer M.D.   On: 06/05/2016 11:52    EKG:   Orders placed or performed during the hospital encounter of 06/03/16  . EKG 12-Lead  . EKG 12-Lead  . EKG 12-Lead  . EKG 12-Lead    ASSESSMENT AND PLAN:   83 year old male with past  medical history of Parkinson's disease, hypertension, hyperlipidemia, diabetes, chronic atrial fibrillation, status post recent mechanical fall with a subarachnoid hemorrhage who presents to the hospital due to altered mental status and noted to be hypoxic.  1. Acute respiratory failure with hypoxia-suspected to be  secondary to aspiration pneumonia/pneumonitis. -Chest x-ray shows RLL pneumonia. -Continue O2 supplementation, prn nebs added. - continue zosyn IV.  2. DM with hyperglycemia- oral meds on hold - started lantus as sugars in 400 range on admission while NPO but now started on D5 - a1c is 7.2 and also added SSI  3. Acute kidney injury- from ATN and urinary retention - on IV fluids, avoid nephrotoxins - foley placed  - renal US with distended bladder (prior to foley)  4. Altered mental status-metabolic encephalopathy secondary to pneumonia/acute kidney injury. -Patient did have a recent subarachnoid hemorrhage with CT scan admitted in the ER shows improvement in the evolving hemorrhage. -Continue antibiotics, treat underlying acute kidney injury  5. History of Parkinson's disease-continue Sinemet.  6. Hypernatremia- from poor PO intake. Increase D5  7. DVT Prophylaxis SCDs  All the records are reviewed and case discussed with Care Management/Social Workerr. Management plans discussed with the patient, family and they are in agreement.  CODE STATUS: DNR  TOTAL TIME TAKING CARE OF THIS PATIENT: 35 minutes.   POSSIBLE D/C IN 1-2 DAYS, DEPENDING ON CLINICAL CONDITION.  Hillary Bow R M.D on 06/06/2016 at 4:23 PM  Between 7am to 6pm - Pager - [phone]  After 6pm go to [email or web address] - password [email or web address] [place] [facility]  Office  [phone]  CC: Primary care physician; Rusty Aus, MD

## 2016-06-06 NOTE — Progress Notes (Signed)
MD sudini updated on patient status seeming less alert today than yesterday, BP trending upwards and respiratory status on 3L oxygen. Pt not able to take PO medications this am due to swallowing difficulty, Speech evaluation pending as well as Palliative consult. Requested to convert necessary medications to IV form, he stated he will evaluate patient and make appropriate changes. No new orders at this time.

## 2016-06-06 NOTE — Progress Notes (Signed)
SLP Cancellation Note  Patient Details Name: Raymond Navarro MRN: [id number] DOB: September 04, 1928   Cancelled treatment:       Reason Eval/Treat Not Completed: Fatigue/lethargy limiting ability to participate;Patient declined, no reason specified;Patient not medically ready (consulted NSG re: pt's status today; reviewed chart notes). Upon speaking w/ NSG, po's (tsp of thickened liquids) were attempted this morning w/ pt by NSG who immediately noted difficulty swallowing and a gurgling presentation. No further po's given by NSG. NSG has also noted a decline in alertness w/ increased BP and O2 needs. Medications have been converted to IV by MD. A Palliative Care consult is pending; MD is monitoring pt's presentation w/ family at this time to determine further needs/status.  ST services will f/u in the morning to assess appropriateness for oral intake/diet. NSG agreed that holding on performing BSE today would be more appropriate. Recommended frequent oral care for hygiene and stimulation d/t NPO status.   [name], MS, CCC-SLP [name],[name] 06/06/2016, 2:24 PM

## 2016-06-07 ENCOUNTER — Telehealth: Payer: Self-pay

## 2016-06-07 LAB — BASIC METABOLIC PANEL
ANION GAP: 6 (ref 5–15)
BUN: 43 mg/dL — ABNORMAL HIGH (ref 6–20)
CALCIUM: 8 mg/dL — AB (ref 8.9–10.3)
CO2: 27 mmol/L (ref 22–32)
Chloride: 123 mmol/L — ABNORMAL HIGH (ref 101–111)
Creatinine, Ser: 1.69 mg/dL — ABNORMAL HIGH (ref 0.61–1.24)
GFR, EST AFRICAN AMERICAN: 41 mL/min — AB (ref >60)
GFR, EST NON AFRICAN AMERICAN: 35 mL/min — AB (ref >60)
Glucose, Bld: 171 mg/dL — ABNORMAL HIGH (ref 65–99)
POTASSIUM: 3.2 mmol/L — AB (ref 3.5–5.1)
SODIUM: 156 mmol/L — AB (ref 135–145)

## 2016-06-07 LAB — CBC WITH DIFFERENTIAL/PLATELET
BASOS ABS: 0.1 10*3/uL (ref 0–0.1)
BASOS PCT: 1 %
EOS ABS: 0.1 10*3/uL (ref 0–0.7)
Eosinophils Relative: 0 %
HCT: 36.8 % — ABNORMAL LOW (ref 40.0–52.0)
Hemoglobin: 12.2 g/dL — ABNORMAL LOW (ref 13.0–18.0)
Lymphocytes Relative: 3 %
Lymphs Abs: 0.7 10*3/uL — ABNORMAL LOW (ref 1.0–3.6)
MCH: 30.2 pg (ref 26.0–34.0)
MCHC: 33.2 g/dL (ref 32.0–36.0)
MCV: 90.8 fL (ref 80.0–100.0)
Monocytes Absolute: 1 10*3/uL (ref 0.2–1.0)
Monocytes Relative: 4 %
NEUTROS ABS: 23.2 10*3/uL — AB (ref 1.4–6.5)
NEUTROS PCT: 92 %
PLATELETS: 246 10*3/uL (ref 150–440)
RBC: 4.05 MIL/uL — AB (ref 4.40–5.90)
RDW: 13.4 % (ref 11.5–14.5)
WBC: 25.1 10*3/uL — ABNORMAL HIGH (ref 3.8–10.6)

## 2016-06-07 LAB — GLUCOSE, CAPILLARY: Glucose-Capillary: 133 mg/dL — ABNORMAL HIGH (ref 65–99)

## 2016-06-07 MED ORDER — ATROPINE SULFATE 1 % OP SOLN
4.0000 [drp] | OPHTHALMIC | Status: DC | PRN
  Filled 2016-06-07: qty 2

## 2016-06-07 MED ORDER — LORAZEPAM 2 MG/ML IJ SOLN
1.0000 mg | INTRAMUSCULAR | Status: DC | PRN
  Administered 2016-06-08 (×2): 1 mg via INTRAVENOUS
  Filled 2016-06-07 (×2): qty 1

## 2016-06-07 MED ORDER — MORPHINE SULFATE (PF) 2 MG/ML IV SOLN
2.0000 mg | INTRAVENOUS | Status: DC | PRN
  Administered 2016-06-07 – 2016-06-08 (×4): 2 mg via INTRAVENOUS
  Filled 2016-06-07 (×4): qty 1

## 2016-06-07 MED ORDER — POLYVINYL ALCOHOL 1.4 % OP SOLN
1.0000 [drp] | Freq: Four times a day (QID) | OPHTHALMIC | Status: DC | PRN
  Filled 2016-06-07: qty 15

## 2016-06-07 NOTE — Progress Notes (Signed)
Pharmacy Antibiotic Note  Raymond Navarro is a 83 y.o. male admitted on 06/03/2016 with aspiration pneumonia.  Pharmacy has been consulted for Zosyn dosing. Plan: Will continue Zosyn 3.375 g IV q8 hours.    Weight: 142 lb (64.4 kg)  Temp (24hrs), Avg:98.3 F (36.8 C), Min:98.2 F (36.8 C), Max:98.5 F (36.9 C)   Recent Labs Lab 06/03/16 1320 06/03/16 1328 06/04/16 0451 06/05/16 0427 06/06/16 0907  WBC  --  19.8* 17.2* 16.3* 23.7*  CREATININE 2.13*  --  2.10* 2.39* 2.15*    Estimated Creatinine Clearance: 22.9 mL/min (by C-G formula based on SCr of 2.15 mg/dL (H)).    Allergies  Allergen Reactions  . Niacin And Related Itching and Rash    Antimicrobials this admission: 12/30 Zosyn >>   Dose adjustments this admission:   Microbiology results: BCx:  UCx:  Sputum:  MRSA PCR:   Thank you for allowing pharmacy to be a part of this patient's care.  Larene Beach, PharmD, BCPS Clinical Pharmacist 06/07/2016 9:16 AM

## 2016-06-07 NOTE — Telephone Encounter (Signed)
PLEASE NOTE: All timestamps contained within this report are represented as [place] Standard Time. CONFIDENTIALTY NOTICE: This fax transmission is intended only for the addressee. It contains information that is legally privileged, confidential or otherwise protected from use or disclosure. If you are not the intended recipient, you are strictly prohibited from reviewing, disclosing, copying using or disseminating any of this information or taking any action in reliance on or regarding this information. If you have received this fax in error, please notify us immediately by telephone so that we can arrange for its return to [place]. Phone: [phone], Toll-Free: [phone], Fax: [phone] Page: 1 of 1 Call Id: [id number] [place] Night - Client Nonclinical Telephone Record [place] Night - Client Client Site [place] Physician Viviana Simpler - MD Contact Type Call Who Is Calling Physician / Provider / Hospital Call Type Provider Call [facility] Page Now Reason for Call Request to speak to Physician Initial Comment Baker Janus nurse from [facility] would like to get an order to send PT to the ER. Pt's level of consciousness has changed, he is barely responsive. PT has not been on insulin but his sugar level is 572 and had a lot of diff swallowing this AM. Additional Comment Patient Name Raymond Navarro Patient DOB June 17, 1928 Requesting Provider Baker Janus, nurse Physician Number [phone] / [phone] Facility Name [facility] Paging DoctorName Phone DateTime Result/Outcome Message Type Notes Alysia Penna - MD [id number] 06/03/2016 12:27:59 PM Paged On Call to Other Provider Doctor Paged [facility]: Please call Baker Janus with [facility] at [phone] or [phone]. Alysia Penna - MD 06/03/2016 12:28:08 PM Paged On Call to Another  Provider Message Result Call Closed By: Dalia Heading Transaction Date/Time: 06/03/2016 12:12:09 PM (ET)

## 2016-06-07 NOTE — Telephone Encounter (Signed)
Per chart review tab pt was admitted to [facility] on 06/03/16.

## 2016-06-07 NOTE — Progress Notes (Signed)
Clinical Education officer, museum (CSW) received verbal consult from MD that patient will need hospice and is appropriate for hospice home. CSW met with patient and his wife Raymond Navarro, daughter Raymond Navarro and son in law Raymond Navarro were at bedside. CSW discussed hospice placement. Family chose [place]. Family is considering hospice home in [place] or [place] SNF with hospice. Referral was made to [facility]. [facility] admissions coordinator at [facility] is aware of above and will reach out to son in law to discuss financial arrangements if patient returns to [facility] with hospice. CSW will continue to follow and assist as needed.   [employer], LCSW [phone]

## 2016-06-07 NOTE — Progress Notes (Signed)
Advance care planning  Patient unable to make decisions of his own. Met with patient's wife and daughter in the room. Worsening leukocytosis and mental status.  Discussed regarding patient's wishes. He had mentioned clearly that he would didn't want to be on artificial life support or PEG tube. Also with his worsening condition or no improvement in spite of aggressive treatment family has decided to move ahead with comfort measures and transfer patient to hospice home when bed is available.  Orders placed.  Discussed with nurse.  Time spent 20 minutes.

## 2016-06-07 NOTE — Telephone Encounter (Signed)
Not doing well and being transferred to hospice home

## 2016-06-07 NOTE — Progress Notes (Signed)
Raymond Navarro is an 83 year old man with a known history of a. Fib, with a permanent pace maker in place, chronic anemia, HTN, Hyperlipidemia, Parkinson's disease,  DM type II, recent fall s/p subarachnoid hemorrhage who was just discharged from [facility] to [facility] on 12/28, who came [facility] for evaluation of  altered mental status. In the ED he was noted  to be hypoxic and in acute renal failure. Per chart note review family reports increased lethargy and confusion with poor oral intake since his fall.  He has received IV fluids and antibiotics and has not improved. Attending physician has spoken with patient's family and they have chosen to focus on comfort with request for transfer to the hospice patient's wife Raymond Navarro, daughter Raymond Navarro and son in law Raymond Navarro to initiate education regarding hospice services, philosophy and team approach to care with good understanding voiced. Questions answered, consents signed. Patient seen lying in bed, responded to voiced, denied pain. Per chart note review he has received 2 doses of IV morphine this morning and a dose of IV lorazepam overnight. Respiratory rate 32. Writer spoke with both family and staff RN Marjory Lies regarding use of PRN morphine for increased respiratory rate/dyspnea, both voiced understanding and agreement for use. Also discussed the use of PRN morphine for treatment of pain prior to care/repositioning as per family patient calls out in pain when turned due to his pelvic fracture.  Family and hospital care team all aware that there is no bed availability at the hospice home. Patient information faxed to hospice referral. Will continue to follow and update family and hospital care team as to bed availability . Flo Shanks RN, BSN, [place] [facility] [place], [facility] [phone] C

## 2016-06-07 NOTE — Progress Notes (Signed)
[place] at [place] NAME: Raymond Navarro    MR#:  [id number]  DATE OF BIRTH:  1929/01/22  SUBJECTIVE:  CHIEF COMPLAINT:   Chief Complaint  Patient presents with  . Altered Mental Status   Continues to be lethargic and unresponsive.  REVIEW OF SYSTEMS:  Review of Systems  Unable to perform ROS: Mental status change    DRUG ALLERGIES:   Allergies  Allergen Reactions  . Niacin And Related Itching and Rash    VITALS:  Blood pressure (!) 153/63, pulse (!) 104, temperature 98.5 F (36.9 C), temperature source Oral, resp. rate 15, weight 64.4 kg (142 lb), SpO2 96 %.  PHYSICAL EXAMINATION:  Physical Exam  GENERAL:  83 y.o.-year-old patient lying in the bed with no acute distress. Looks critically ill  EYES: Pupils equal, round, reactive to light and accommodation. No scleral icterus. Extraocular muscles intact.  HEENT: bruising around his left eye. NECK:  Supple, no jugular venous distention. No thyroid enlargement, no tenderness.  LUNGS: Normal breath sounds bilaterally, Scattered wheezing, no rales,rhonchi or crepitation. No use of accessory muscles of respiration. Decreased bibasilar breath sounds CARDIOVASCULAR: S1, S2 normal. No rubs, or gallops. 2/6 systolic murmur present. ABDOMEN: Soft, nontender, nondistended. Bowel sounds present. No organomegaly or mass.  EXTREMITIES: No pedal edema, cyanosis, or clubbing.  NEUROLOGIC: Drowzy PSYCHIATRIC: The patient is lethargic, arousable.   LABORATORY PANEL:   CBC  Recent Labs Lab 06/07/16 0854  WBC 25.1*  HGB 12.2*  HCT 36.8*  PLT 246   ------------------------------------------------------------------------------------------------------------------  Chemistries   Recent Labs Lab 06/03/16 1320  06/05/16 0427  06/07/16 0854  NA 142  < > 153*  < > 156*  K 4.1  < > 3.5  < > 3.2*  CL 109  < > 123*  < > 123*  CO2 23  < > 23  < > 27  GLUCOSE 508*  < > 226*  < > 171*    BUN 55*  < > 63*  < > 43*  CREATININE 2.13*  < > 2.39*  < > 1.69*  CALCIUM 7.9*  < > 7.7*  < > 8.0*  MG  --   --  2.5*  --   --   AST 16  --   --   --   --   ALT <5*  --   --   --   --   ALKPHOS 80  --   --   --   --   BILITOT 1.4*  --   --   --   --   < > = values in this interval not displayed. ------------------------------------------------------------------------------------------------------------------  Cardiac Enzymes No results for input(s): TROPONINI in the last 168 hours. ------------------------------------------------------------------------------------------------------------------  RADIOLOGY:  No results found.  EKG:   Orders placed or performed during the hospital encounter of 06/03/16  . EKG 12-Lead  . EKG 12-Lead  . EKG 12-Lead  . EKG 12-Lead    ASSESSMENT AND PLAN:   83 year old male with past medical history of Parkinson's disease, hypertension, hyperlipidemia, diabetes, chronic atrial fibrillation, status post recent mechanical fall with a subarachnoid hemorrhage who presents to the hospital due to altered mental status and noted to be hypoxic.  1. Acute respiratory failure with hypoxia-suspected to be secondary to aspiration pneumonia - worsening Leukocytosis  -Continue O2 supplementation, prn nebs added. - continue zosyn IV.  2. DM with hyperglycemia- oral meds on hold  3. Acute kidney injury- from ATN and  urinary retention - on IV fluids, avoid nephrotoxins - foley placed  - renal US with distended bladder (prior to foley)  4. Altered mental status-metabolic encephalopathy secondary to pneumonia/acute kidney injury. -Patient did have a recent subarachnoid hemorrhage with CT scan admitted in the ER shows improvement in the evolving hemorrhage.  5. History of Parkinson's disease-continue Sinemet.  6. Hypernatremia- from poor PO intake. On D5  7. DVT Prophylaxis SCDs  All the records are reviewed and case discussed with Care  Management/Social Workerr. Management plans discussed with the patient, family and they are in agreement.  CODE STATUS: DNR  TOTAL TIME TAKING CARE OF THIS PATIENT: 25 minutes.   POSSIBLE D/C IN 1-2 DAYS, DEPENDING ON CLINICAL CONDITION.  Hillary Bow R M.D on 06/07/2016 at 1:27 PM  Between 7am to 6pm - Pager - [phone]  After 6pm go to [email or web address] - password [email or web address] [place] [facility]  Office  [phone]  CC: Primary care physician; Rusty Aus, MD

## 2016-06-08 LAB — CULTURE, BLOOD (ROUTINE X 2)
Culture: NO GROWTH
Culture: NO GROWTH

## 2016-06-08 MED ORDER — LORAZEPAM 0.5 MG PO TABS: 0.5000 mg | ORAL_TABLET | ORAL | 0 refills | Status: AC | PRN

## 2016-06-08 MED ORDER — MORPHINE SULFATE (CONCENTRATE) 20 MG/ML PO SOLN: 5.0000 mg | ORAL | 0 refills | Status: AC | PRN

## 2016-06-08 NOTE — Progress Notes (Signed)
Initial rounding on pt at 0800, pt vs checked at family request. Pt resting in bed with eyes closed, o2 in place, some faint gurgling heard to upper airway. PIV #22 intact to L wrist area, site is free of redness and swelling. Pt has some bruising to l side of his face. Since initial round, pt has had oral care doen by this writer, oral membranes are dry. Pt received ativan ivp x1 at family request. Pt able to nod his head yes and no to some questions. Multiple family members at bedside.

## 2016-06-08 NOTE — Progress Notes (Signed)
Patient's family has chosen to move patient to the [place] facility today. [facility] liaison is aware of above. Clinical Education officer, museum (CSW) prepared EMS packet. RN aware of above. [facility] admissions coordinator at [facility] is aware of above. Please reconsult if future social work needs arise. CSW signing off.   [employer], LCSW [phone]

## 2016-06-08 NOTE — Progress Notes (Signed)
Per Seth Bake admissions coordinator at [facility] she will call patient's son in law today to discuss coming back to [facility] with hospice.    [employer], LCSW [phone]

## 2016-06-08 NOTE — Care Management Important Message (Signed)
Important Message  Patient Details  Name: HENG GOLDNER MRN: [id number] Date of Birth: 03-01-1929   Medicare Important Message Given:  Yes    Jolly Mango, RN 06/11/2016, 1:56 PM

## 2016-06-08 NOTE — Progress Notes (Signed)
Follow up visit made to new hospice home referral. Patient seen lying in bed,he has required IV lorazepam for agitation this morning, appears to be resting, no oral intake today. Remains family at bedside. Family alerted to bed availability and are agreeable to transfer. Hospital care team made aware. Report called to the hospice home, discharge summary faxed to referral, EMS notified for transport with request for pick up at 3:30. Family and staff RN Almyra Free made aware. Almyra Free and family also in agreement with premedication for pain prior to transport. Thank you. Flo Shanks RN, BSN, [facility] [place], hospital Liaison [phone]

## 2016-06-08 NOTE — Discharge Summary (Signed)
[place] [facility] [place] NAME: Raymond Navarro    MR#:  [id number]  DATE OF BIRTH:  Nov 16, 1928  DATE OF ADMISSION:  06/03/2016 ADMITTING PHYSICIAN: Henreitta Leber, MD  DATE OF DISCHARGE: No discharge date for patient encounter.  PRIMARY CARE PHYSICIAN: Rusty Aus, MD   ADMISSION DIAGNOSIS:  Acute respiratory failure with hypoxia ([place]) [J96.01] Acute encephalopathy [G93.40] AKI (acute kidney injury) ([place]) [N17.9] Hospital acquired PNA [J18.9]  DISCHARGE DIAGNOSIS:  Active Problems:   Altered mental status Acute encephalopathy Hypernatremia Dehydration Acute kidney injury Right lower lobe aspiration pneumonia Acute hypoxic respiratory failure  SECONDARY DIAGNOSIS:   Past Medical History:  Diagnosis Date  . Anemia   . Atrial fibrillation, chronic ([place])   . Cancer (HCC)    hx of skin cancer  . Colitis    Hx of  . Diabetes mellitus    type 2  . Dysrhythmia    ATRIAL FIBRILATION  . History of chicken pox   . Hyperlipidemia   . Hypertension   . Incontinence of feces   . Parkinson disease ([place])   . Presence of permanent cardiac pacemaker 01/11/12   [employer]onic [place] Single chamber, Serial # [id number]  . Vitamin D deficiency   . Wears hearing aid    bilateral     ADMITTING HISTORY  HISTORY OF PRESENT ILLNESS:  Raymond Navarro  is a 83 y.o. male with a known history of a. Fib, hx of chronic anemia, HTN, Hyperlipidemia, hx of Parkinson's disease, hx of Permanent pacemaker, DM type II, recent fall s/p Subarachnoid hemorrhage who was just discharged from [facility] to [facility], Who presents to the hospital due to altered mental status and also noted  to be hypoxic.  Patient as per the family is more confused than usual. Since he was discharged from [facility] after his subarachnoid hemorrhage his oral intake has been poor and he has been more lethargic. There has been no seizure type activity. Has been no fever,  nausea, vomiting or any other associated symptoms. Patient presented to the emergency room and was noted to be in acute renal failure.  Hospitalist services were contacted for further treatment and evaluation.    HOSPITAL COURSE:   83 year old male with past medical history of Parkinson's disease, hypertension, hyperlipidemia, diabetes, chronic atrial fibrillation, status post recent mechanical fall with a subarachnoid hemorrhage who presents to the hospital due to altered mental status and noted to be hypoxic.  1. Acute respiratory failure with hypoxia-suspected to be secondary to aspiration Pneumonia 2. DM with hyperglycemia- oral meds on hold 3. Acute kidney injury- from ATN and urinary retention 4. Altered mental status-metabolic encephalopathy 6. Hypernatremia 7. DVT Prophylaxis  Patient was treated aggressively with D5, oxygen support, nebulizers, IV antibiotics. With this patient did not improve well. On further discussing with the family patient did not want any aggressive measures. He was DO NOT RESUSCITATE. If no improvement in spite of aggressive treatment patient was placed on comfort measures including morphine and Ativan as needed. IV antibiotics and fluids were stopped.  Patient continues to slowly deteriorate but is comfortable at this point. And is being transferred to the hospice home for end-of-life care.  CONSULTS OBTAINED:  Treatment Team:  Gladstone Lighter, MD  DRUG ALLERGIES:   Allergies  Allergen Reactions  . Niacin And Related Itching and Rash    DISCHARGE MEDICATIONS:   Current Discharge Medication List    START taking these medications  Details  LORazepam (ATIVAN) 0.5 MG tablet Take 1 tablet (0.5 mg total) by mouth every 4 (four) hours as needed for anxiety. Refills: 0    morphine (ROXANOL) 20 MG/ML concentrated solution Take 0.25 mLs (5 mg total) by mouth every hour as needed for shortness of breath. Qty: 1 mL, Refills: 0      STOP taking these  medications     alfuzosin (UROXATRAL) 10 MG 24 hr tablet      b complex vitamins tablet      brimonidine (ALPHAGAN) 0.2 % ophthalmic solution      carbidopa-levodopa (SINEMET IR) 25-100 MG tablet      Cholecalciferol (VITAMIN D PO)      diltiazem (CARDIZEM CD) 180 MG 24 hr capsule      glimepiride (AMARYL) 4 MG tablet      hydrALAZINE (APRESOLINE) 10 MG tablet      metFORMIN (GLUCOPHAGE-XR) 500 MG 24 hr tablet      pravastatin (PRAVACHOL) 20 MG tablet      sertraline (ZOLOFT) 50 MG tablet      sitaGLIPtin (JANUVIA) 50 MG tablet      acetaminophen (TYLENOL) 500 MG tablet      amoxicillin (AMOXIL) 500 MG capsule         Today   VITAL SIGNS:  Blood pressure (!) 174/87, pulse (!) 111, temperature 99.1 F (37.3 C), temperature source Oral, resp. rate 14, weight 64.4 kg (142 lb), SpO2 95 %.  I/O:   Intake/Output Summary (Last 24 hours) at 06/26/2016 1300 Last data filed at 06/07/16 2242  Gross per 24 hour  Intake                0 ml  Output              400 ml  Net             -400 ml    PHYSICAL EXAMINATION:  Physical Exam  GENERAL:  83 y.o.-year-old patient lying in the bed PSYCHIATRIC: The patient is adrowzy SKIN: Bruising  DATA REVIEW:   CBC  Recent Labs Lab 06/07/16 0854  WBC 25.1*  HGB 12.2*  HCT 36.8*  PLT 246    Chemistries   Recent Labs Lab 06/03/16 1320  06/05/16 0427  06/07/16 0854  NA 142  < > 153*  < > 156*  K 4.1  < > 3.5  < > 3.2*  CL 109  < > 123*  < > 123*  CO2 23  < > 23  < > 27  GLUCOSE 508*  < > 226*  < > 171*  BUN 55*  < > 63*  < > 43*  CREATININE 2.13*  < > 2.39*  < > 1.69*  CALCIUM 7.9*  < > 7.7*  < > 8.0*  MG  --   --  2.5*  --   --   AST 16  --   --   --   --   ALT <5*  --   --   --   --   ALKPHOS 80  --   --   --   --   BILITOT 1.4*  --   --   --   --   < > = values in this interval not displayed.  Cardiac Enzymes No results for input(s): TROPONINI in the last 168 hours.  Microbiology Results  Results for  orders placed or performed during the hospital encounter of 06/03/16  Blood culture (routine x  2)     Status: None   Collection Time: 06/03/16  4:25 PM  Result Value Ref Range Status   Specimen Description BLOOD LEFT ARM  Final   Special Requests BOTTLES DRAWN AEROBIC AND ANAEROBIC ANA8ML AER 9ML  Final   Culture NO GROWTH 5 DAYS  Final   Report Status 06/11/2016 FINAL  Final  Blood culture (routine x 2)     Status: None   Collection Time: 06/03/16  4:25 PM  Result Value Ref Range Status   Specimen Description BLOOD RIGHT HAND  Final   Special Requests   Final    BOTTLES DRAWN AEROBIC AND ANAEROBIC ANA11ML AER10ML   Culture NO GROWTH 5 DAYS  Final   Report Status 06/12/2016 FINAL  Final    RADIOLOGY:  No results found.  Follow up with PCP in 1 week.  Management plans discussed with the patient, family and they are in agreement.  CODE STATUS:     Code Status Orders        Start     Ordered   06/07/16 1023  Do not attempt resuscitation (DNR)  Continuous    Question Answer Comment  In the event of cardiac or respiratory ARREST Do not call a "code blue"   In the event of cardiac or respiratory ARREST Do not perform Intubation, CPR, defibrillation or ACLS   In the event of cardiac or respiratory ARREST Use medication by any route, position, wound care, and other measures to relive pain and suffering. May use oxygen, suction and manual treatment of airway obstruction as needed for comfort.      06/07/16 1022    Code Status History    Date Active Date Inactive Code Status Order ID Comments User Context   06/03/2016  8:26 PM 06/07/2016 10:22 AM DNR [id number]  Henreitta Leber, MD Inpatient   06/03/2016  4:14 PM 06/03/2016  8:26 PM DNR [id number]  Merlyn Lot, MD ED   05/28/2016  6:52 PM 06/01/2016  7:12 PM DNR [id number]  Hosie Poisson, MD Inpatient   05/28/2016  1:38 AM 05/28/2016  6:52 PM Full Code [id number]  Norval Morton, MD ED    Advance Directive Documentation    Flowsheet Row Most Recent Value  Type of Advance Directive  Living will, Out of facility DNR (pink MOST or yellow form)  Pre-existing out of facility DNR order (yellow form or pink MOST form)  No data  "MOST" Form in Place?  No data      TOTAL TIME TAKING CARE OF THIS PATIENT ON DAY OF DISCHARGE: more than 30 minutes.   Hillary Bow R M.D on 06/24/2016 at 1:00 PM  Between 7am to 6pm - Pager - [phone]  After 6pm go to [email or web address] - password [email or web address] [place] [facility]  Office  [phone]  CC: Primary care physician; Rusty Aus, MD  Note: This dictation was prepared with Dragon dictation along with smaller phrase technology. Any transcriptional errors that result from this process are unintentional.

## 2016-06-08 NOTE — Progress Notes (Signed)
EMS here for pt transport to hospice house. This Probation officer gave pt morphine ivp and dc'd piv with catheter intact. Pt remains on O2 by [place], gurgling respirations noted, especially when pt is lying flat. Foley intact and draining amber urine with sediment noted. Pt in no distress.

## 2016-06-08 NOTE — Plan of Care (Signed)
Problem: Bowel/Gastric: Goal: Will not experience complications related to bowel motility Outcome: Adequate for Discharge Pt is dc'd to hospice.

## 2016-07-06 DEATH — deceased

## 2017-02-17 IMAGING — US US RENAL
1 series · 14 of 25 positions shown · non-contrast
Comparison: CT 06/15/2010

CLINICAL DATA: Acute renal failure

EXAM:
RENAL / URINARY TRACT ULTRASOUND COMPLETE

[Series 1: us renal · 0.26mm/px · 14 of 50 slices shown]
[im 1/50]
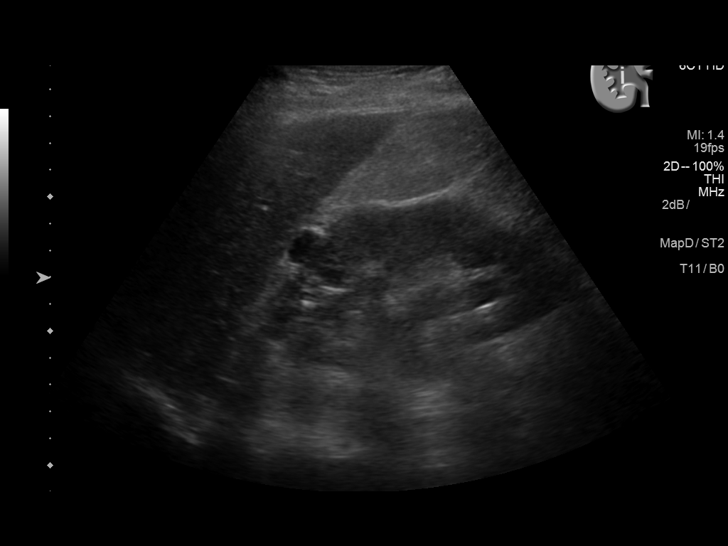
[im 5/50]
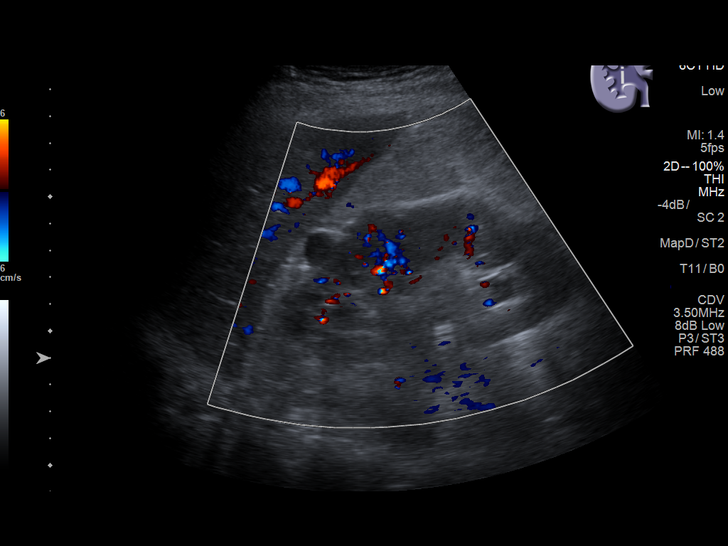
[im 9/50]
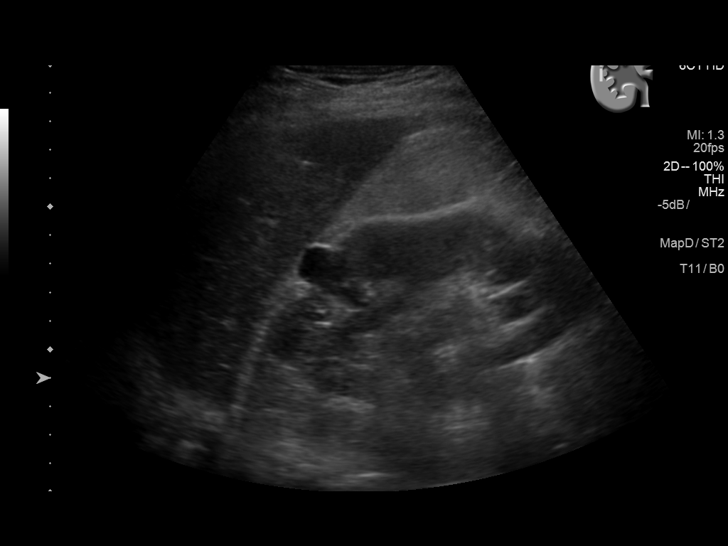
[im 13/50]
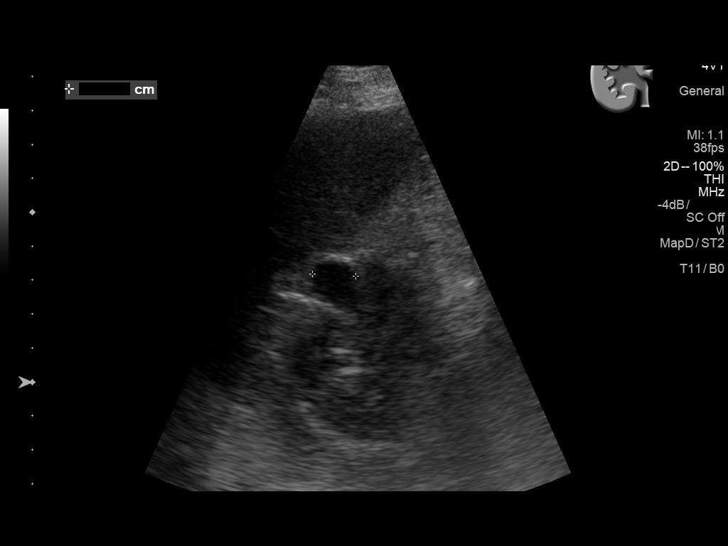
[im 17/50]
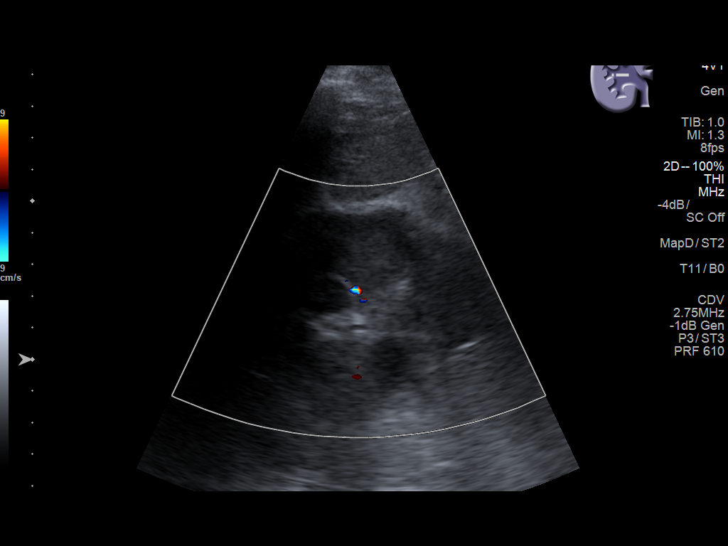
[im 19/50]
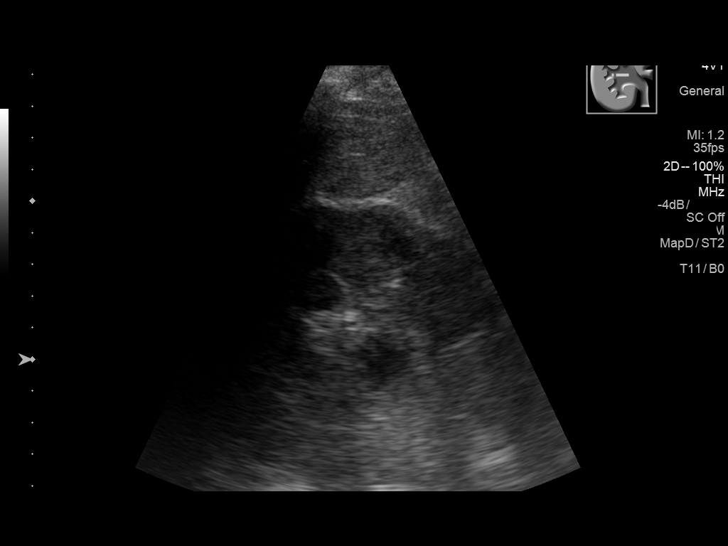
[im 23/50]
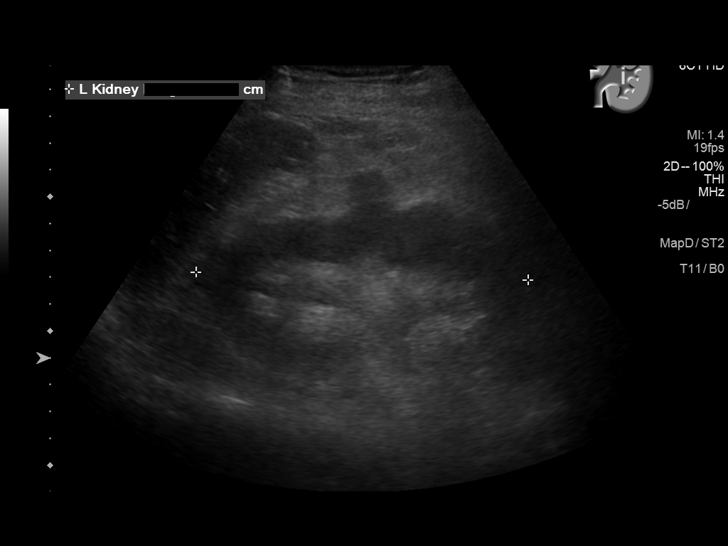
[im 27/50]
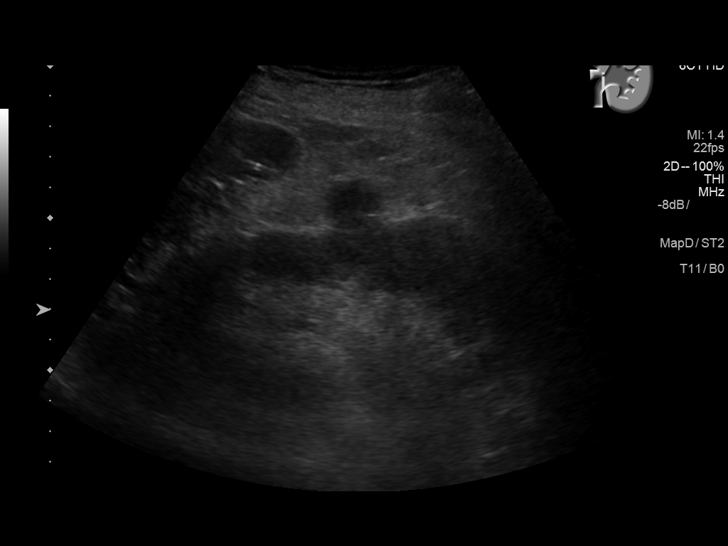
[im 31/50]
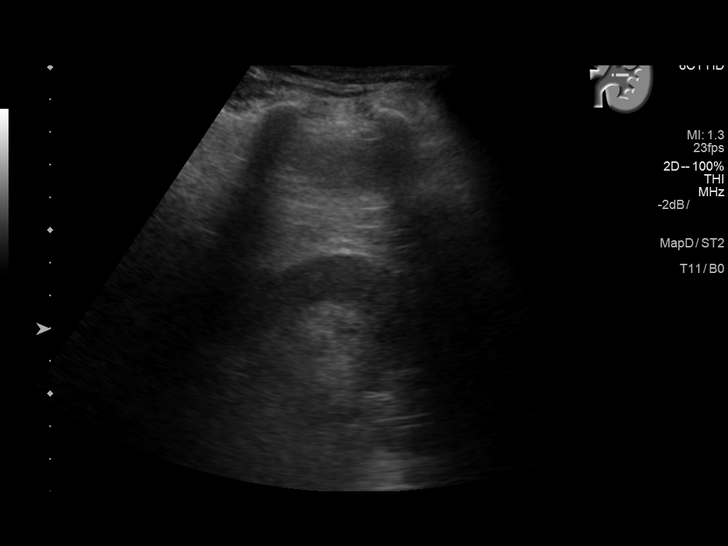
[im 33/50]
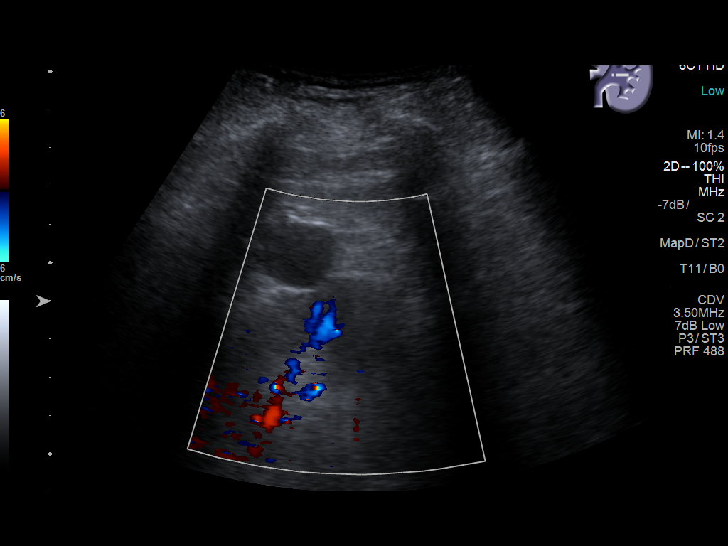
[im 37/50]
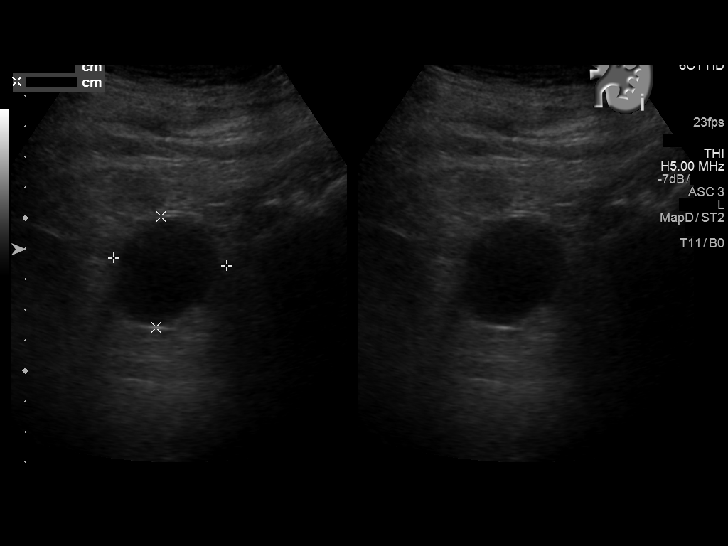
[im 41/50]
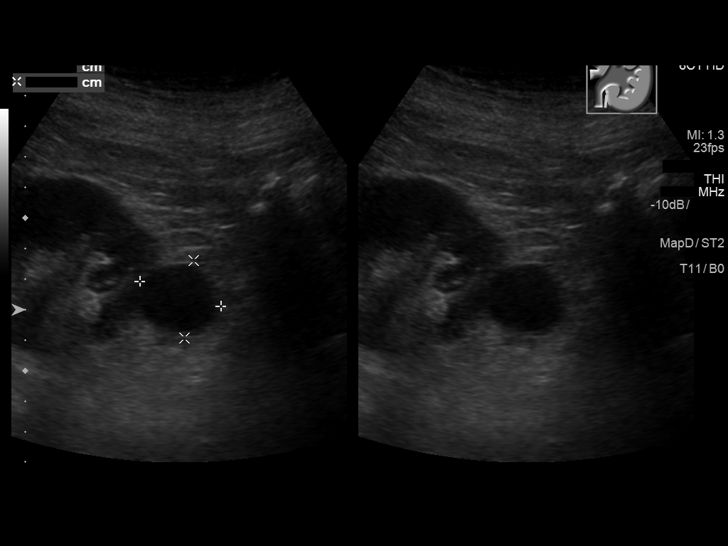
[im 45/50]
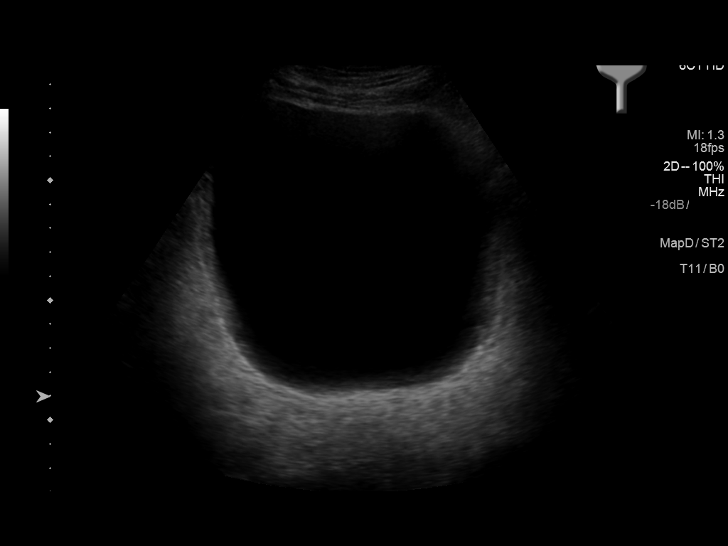
[im 50/50]
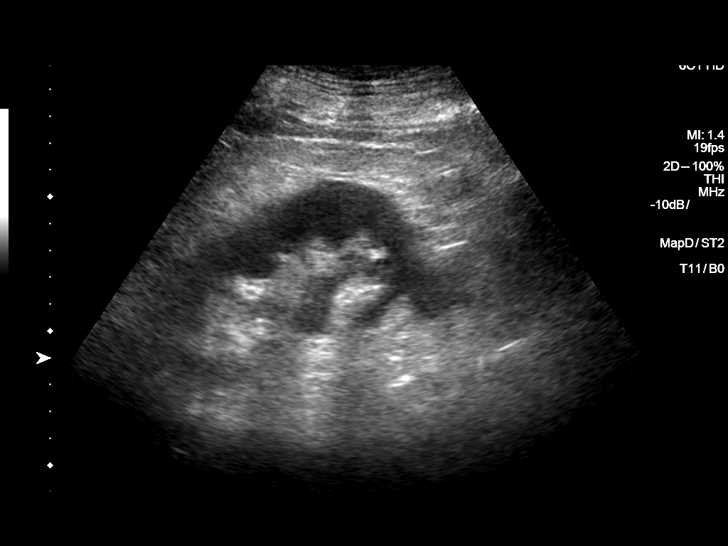

[14 of 25 positions shown; findings below may reference images not displayed]

FINDINGS: Right Kidney:

Length: 11.6 cm. Mild to moderate hydronephrosis. Increased
echotexture throughout the right kidney. Small cyst in the upper
pole measures 2 cm.

Left Kidney:

Length: 12.4 cm. Mild left hydronephrosis. Multiple left renal
cysts, the largest 3.7 cm in the lower pole. Mildly increased
echotexture.

Bladder:

Distended.  No bladder wall abnormality.
IMPRESSION: Moderate bladder distention. There is mild bilateral hydronephrosis.

Mildly increased echotexture bilaterally suggesting chronic medical
renal disease.

Bilateral renal cysts which appear benign.
